# Patient Record
Sex: Female | Born: 1955 | Race: White | Hispanic: No | Marital: Married | Smoking: Never smoker
Health system: Southern US, Community
[De-identification: ages and names within clinical notes are randomized; demographics above are authoritative.]

## PROBLEM LIST (undated history)

## (undated) DIAGNOSIS — N200 Calculus of kidney: Secondary | ICD-10-CM

## (undated) DIAGNOSIS — E039 Hypothyroidism, unspecified: Secondary | ICD-10-CM

## (undated) DIAGNOSIS — E785 Hyperlipidemia, unspecified: Secondary | ICD-10-CM

## (undated) DIAGNOSIS — E669 Obesity, unspecified: Secondary | ICD-10-CM

## (undated) HISTORY — DX: Calculus of kidney: N20.0

## (undated) HISTORY — PX: OTHER SURGICAL HISTORY: SHX169

## (undated) HISTORY — PX: BREAST BIOPSY: SHX20

## (undated) HISTORY — DX: Hyperlipidemia, unspecified: E78.5

---

## 1990-06-09 HISTORY — PX: BACK SURGERY: SHX140

## 2003-10-09 ENCOUNTER — Other Ambulatory Visit: Admission: RE | Admit: 2003-10-09 | Discharge: 2003-10-09 | Payer: Self-pay | Admitting: Family Medicine

## 2005-12-16 ENCOUNTER — Other Ambulatory Visit: Admission: RE | Admit: 2005-12-16 | Discharge: 2005-12-16 | Payer: Self-pay | Admitting: Family Medicine

## 2008-10-20 ENCOUNTER — Encounter: Admission: RE | Admit: 2008-10-20 | Discharge: 2008-10-20 | Payer: Self-pay | Admitting: Family Medicine

## 2009-10-22 ENCOUNTER — Encounter: Admission: RE | Admit: 2009-10-22 | Discharge: 2009-10-22 | Payer: Self-pay | Admitting: Family Medicine

## 2010-09-25 ENCOUNTER — Other Ambulatory Visit: Payer: Self-pay | Admitting: Family Medicine

## 2010-09-25 DIAGNOSIS — Z1231 Encounter for screening mammogram for malignant neoplasm of breast: Secondary | ICD-10-CM

## 2010-10-24 ENCOUNTER — Ambulatory Visit
Admission: RE | Admit: 2010-10-24 | Discharge: 2010-10-24 | Disposition: A | Discharge status: Home / Self Care | Payer: 59 | Source: Ambulatory Visit | Attending: Family Medicine | Admitting: Family Medicine

## 2010-10-24 DIAGNOSIS — Z1231 Encounter for screening mammogram for malignant neoplasm of breast: Secondary | ICD-10-CM

## 2010-10-25 ENCOUNTER — Other Ambulatory Visit: Payer: Self-pay | Admitting: Family Medicine

## 2010-10-25 DIAGNOSIS — R928 Other abnormal and inconclusive findings on diagnostic imaging of breast: Secondary | ICD-10-CM

## 2010-11-01 ENCOUNTER — Other Ambulatory Visit: Payer: Self-pay | Admitting: Family Medicine

## 2010-11-01 ENCOUNTER — Ambulatory Visit
Admission: RE | Admit: 2010-11-01 | Discharge: 2010-11-01 | Disposition: A | Discharge status: Home / Self Care | Payer: 59 | Source: Ambulatory Visit | Attending: Family Medicine | Admitting: Family Medicine

## 2010-11-01 DIAGNOSIS — R928 Other abnormal and inconclusive findings on diagnostic imaging of breast: Secondary | ICD-10-CM

## 2010-11-05 ENCOUNTER — Ambulatory Visit
Admission: RE | Admit: 2010-11-05 | Discharge: 2010-11-05 | Disposition: A | Discharge status: Home / Self Care | Payer: 59 | Source: Ambulatory Visit | Attending: Family Medicine | Admitting: Family Medicine

## 2010-11-05 ENCOUNTER — Other Ambulatory Visit: Payer: Self-pay | Admitting: Family Medicine

## 2010-11-05 ENCOUNTER — Other Ambulatory Visit: Payer: Self-pay | Admitting: Diagnostic Radiology

## 2010-11-05 DIAGNOSIS — R928 Other abnormal and inconclusive findings on diagnostic imaging of breast: Secondary | ICD-10-CM

## 2011-10-13 ENCOUNTER — Other Ambulatory Visit: Payer: Self-pay | Admitting: Nurse Practitioner

## 2011-10-13 DIAGNOSIS — Z1231 Encounter for screening mammogram for malignant neoplasm of breast: Secondary | ICD-10-CM

## 2011-10-28 ENCOUNTER — Ambulatory Visit
Admission: RE | Admit: 2011-10-28 | Discharge: 2011-10-28 | Disposition: A | Discharge status: Home / Self Care | Payer: 59 | Source: Ambulatory Visit | Attending: Nurse Practitioner | Admitting: Nurse Practitioner

## 2011-10-28 DIAGNOSIS — Z1231 Encounter for screening mammogram for malignant neoplasm of breast: Secondary | ICD-10-CM

## 2011-10-31 ENCOUNTER — Other Ambulatory Visit: Payer: Self-pay | Admitting: Nurse Practitioner

## 2011-10-31 DIAGNOSIS — R928 Other abnormal and inconclusive findings on diagnostic imaging of breast: Secondary | ICD-10-CM

## 2011-11-11 ENCOUNTER — Ambulatory Visit
Admission: RE | Admit: 2011-11-11 | Discharge: 2011-11-11 | Disposition: A | Discharge status: Home / Self Care | Payer: 59 | Source: Ambulatory Visit | Attending: Nurse Practitioner | Admitting: Nurse Practitioner

## 2011-11-11 DIAGNOSIS — R928 Other abnormal and inconclusive findings on diagnostic imaging of breast: Secondary | ICD-10-CM

## 2012-01-01 ENCOUNTER — Inpatient Hospital Stay (HOSPITAL_COMMUNITY)
Admission: EM | Admit: 2012-01-01 | Discharge: 2012-01-05 | DRG: 493 | Disposition: A | Discharge status: Home Health | Payer: 59 | Attending: Orthopaedic Surgery | Admitting: Orthopaedic Surgery

## 2012-01-01 ENCOUNTER — Encounter (HOSPITAL_COMMUNITY): Payer: Self-pay | Admitting: Emergency Medicine

## 2012-01-01 ENCOUNTER — Emergency Department (HOSPITAL_COMMUNITY): Payer: 59

## 2012-01-01 DIAGNOSIS — S82852A Displaced trimalleolar fracture of left lower leg, initial encounter for closed fracture: Secondary | ICD-10-CM | POA: Diagnosis present

## 2012-01-01 DIAGNOSIS — W010XXA Fall on same level from slipping, tripping and stumbling without subsequent striking against object, initial encounter: Secondary | ICD-10-CM | POA: Diagnosis present

## 2012-01-01 DIAGNOSIS — Z809 Family history of malignant neoplasm, unspecified: Secondary | ICD-10-CM

## 2012-01-01 DIAGNOSIS — Z833 Family history of diabetes mellitus: Secondary | ICD-10-CM

## 2012-01-01 DIAGNOSIS — M1710 Unilateral primary osteoarthritis, unspecified knee: Secondary | ICD-10-CM | POA: Diagnosis present

## 2012-01-01 DIAGNOSIS — S82853A Displaced trimalleolar fracture of unspecified lower leg, initial encounter for closed fracture: Principal | ICD-10-CM | POA: Diagnosis present

## 2012-01-01 DIAGNOSIS — Z6841 Body Mass Index (BMI) 40.0 and over, adult: Secondary | ICD-10-CM

## 2012-01-01 DIAGNOSIS — Y92009 Unspecified place in unspecified non-institutional (private) residence as the place of occurrence of the external cause: Secondary | ICD-10-CM

## 2012-01-01 DIAGNOSIS — Z8249 Family history of ischemic heart disease and other diseases of the circulatory system: Secondary | ICD-10-CM

## 2012-01-01 DIAGNOSIS — E039 Hypothyroidism, unspecified: Secondary | ICD-10-CM | POA: Diagnosis present

## 2012-01-01 HISTORY — DX: Morbid (severe) obesity due to excess calories: E66.01

## 2012-01-01 HISTORY — DX: Obesity, unspecified: E66.9

## 2012-01-01 HISTORY — DX: Hypothyroidism, unspecified: E03.9

## 2012-01-01 NOTE — ED Notes (Signed)
Patient states her left knee gave out on her in the shower and she fell hurting her left ankle. Denies LOC.

## 2012-01-01 NOTE — ED Provider Notes (Signed)
History    This chart was scribed for [name], MD, MD by Smitty Pluck. The patient was seen in room APA09 and the patient's care was started at 10:12PM.   CSN: [id number]  Arrival date & time 01/01/12  2156   None     Chief Complaint  Patient presents with  . Fall  . Ankle Pain    (Consider location/radiation/quality/duration/timing/severity/associated sxs/prior treatment) Patient is a 56 y.o. female presenting with fall and ankle pain. The history is provided by the patient.  Fall  Ankle Pain    Courtney Simmons is a 56 y.o. female who presents to the Emergency Department BIB EMS due to fall while coming out of shower causing left ankle injury onset today at 8:30PM. Ankle pain is rated at 5/10. Reports that she has thyroid and she takes thyroxine. 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 Denies allergies to medication.  Dr. Daphine Deutscher at [facility].   Past Medical History  Diagnosis Date  . Hypothyroidism     Past Surgical History  Procedure Date  . Back surgery     History reviewed. No pertinent family history.  History  Substance Use Topics  . Smoking status: Never Smoker   . Smokeless tobacco: Not on file  . Alcohol Use: No    OB History    Grav Para Term Preterm Abortions TAB SAB Ect Mult Living                  Review of Systems  All other systems reviewed and are negative.  10 Systems reviewed and all are negative for acute change except as noted in the HPI.    Allergies  Review of patient's allergies indicates no known allergies.  Home Medications   Current Outpatient Rx  Name Route Sig Dispense Refill  . CHOLECALCIFEROL 400 UNITS PO TABS Oral Take 400 Units by mouth every morning.    . B-12 PO Oral Take 1 tablet by mouth every morning.    Marland Kitchen LEVOTHYROXINE SODIUM 50 MCG PO TABS Oral Take 50 mcg by mouth every morning.       BP 126/75  Pulse 85  Temp 98.3 F (36.8 C) (Oral)   Resp 20  Ht 5\' 7"  (1.702 m)  Wt 260 lb (117.935 kg)  BMI 40.72 kg/m2  SpO2 97%  Physical Exam  Nursing note and vitals reviewed. Constitutional: She is oriented to person, place, and time. She appears well-developed and well-nourished. No distress.       Obese      HENT:  Head: Normocephalic and atraumatic.  Eyes: Conjunctivae are normal.  Neck: Normal range of motion. Neck supple.  Pulmonary/Chest: Effort normal. No respiratory distress.  Musculoskeletal:       Tenderness of medial malleolus of left ankle Sensation of foot intact   Neurological: She is alert and oriented to person, place, and time.  Skin: Skin is warm and dry.  Psychiatric: She has a normal mood and affect. Her behavior is normal.    ED Course  Procedures (including critical care time) DIAGNOSTIC STUDIES: Oxygen Saturation is 97% on room air, normal by my interpretation.    COORDINATION OF CARE: 10:19PM EDP discusses pt ED treatment with pt     Labs Reviewed - No data to display Dg Ankle 2 Views Left  01/01/2012  *RADIOLOGY REPORT*  Clinical Data: Ankle pain status post fall.  LEFT ANKLE - 2 VIEW  Comparison:  None.  Findings: Two views are submitted, mildly limited by positioning and external stabilizer.  There is a comminuted oblique fracture of the distal fibula with posterior displacement.  There is a transverse fracture of the medial malleolus which is laterally displaced.  There is resulting posterior subluxation of the talus with respect to the tibial plafond.  No definite talar or calcaneal fracture is seen. The posterior malleolus of the tibia is likely fractured as well.  There is a plantar calcaneal spur.  IMPRESSION: Fracture dislocation (likely trimalleolar) at the left ankle as described.  Original Report Authenticated By: Gerrianne Scale, M.D.    11:34 PM X-rays show fracture dislocation of the left ankle.  Call to Dr. Hilda Lias, who is coming in to see her.   1. Trimalleolar fracture of  left ankle      I personally performed the services described in this documentation, which was scribed in my presence. The recorded information has been reviewed and considered.  [name], M.D.      Carleene Cooper III, MD 01/01/12 4540  Carleene Cooper III, MD 01/02/12 [phone]

## 2012-01-02 ENCOUNTER — Inpatient Hospital Stay (HOSPITAL_COMMUNITY): Payer: 59

## 2012-01-02 ENCOUNTER — Encounter (HOSPITAL_COMMUNITY): Payer: Self-pay | Admitting: Orthopaedic Surgery

## 2012-01-02 ENCOUNTER — Encounter (HOSPITAL_COMMUNITY): Payer: Self-pay | Admitting: Anesthesiology

## 2012-01-02 ENCOUNTER — Emergency Department (HOSPITAL_COMMUNITY): Payer: 59

## 2012-01-02 ENCOUNTER — Encounter (HOSPITAL_COMMUNITY)
Admission: EM | Disposition: A | Discharge status: Home Health | Payer: Self-pay | Source: Home / Self Care | Attending: Orthopaedic Surgery

## 2012-01-02 ENCOUNTER — Inpatient Hospital Stay (HOSPITAL_COMMUNITY): Payer: 59 | Admitting: Anesthesiology

## 2012-01-02 DIAGNOSIS — S82852A Displaced trimalleolar fracture of left lower leg, initial encounter for closed fracture: Secondary | ICD-10-CM | POA: Diagnosis present

## 2012-01-02 DIAGNOSIS — E039 Hypothyroidism, unspecified: Secondary | ICD-10-CM | POA: Diagnosis present

## 2012-01-02 DIAGNOSIS — S82853A Displaced trimalleolar fracture of unspecified lower leg, initial encounter for closed fracture: Principal | ICD-10-CM

## 2012-01-02 HISTORY — DX: Morbid (severe) obesity due to excess calories: E66.01

## 2012-01-02 HISTORY — PX: ORIF ANKLE FRACTURE: SHX5408

## 2012-01-02 LAB — CBC WITH DIFFERENTIAL/PLATELET
Basophils Absolute: 0 10*3/uL (ref 0.0–0.1)
Basophils Relative: 0 % (ref 0–1)
Eosinophils Absolute: 0.1 10*3/uL (ref 0.0–0.7)
Eosinophils Relative: 1 % (ref 0–5)
HCT: 37.8 % (ref 36.0–46.0)
Hemoglobin: 12.5 g/dL (ref 12.0–15.0)
Lymphocytes Relative: 15 % (ref 12–46)
Lymphs Abs: 1.9 10*3/uL (ref 0.7–4.0)
MCH: 29.6 pg (ref 26.0–34.0)
MCHC: 33.1 g/dL (ref 30.0–36.0)
MCV: 89.6 fL (ref 78.0–100.0)
Monocytes Absolute: 0.4 10*3/uL (ref 0.1–1.0)
Monocytes Relative: 3 % (ref 3–12)
Neutro Abs: 10.2 10*3/uL — ABNORMAL HIGH (ref 1.7–7.7)
Neutrophils Relative %: 81 % — ABNORMAL HIGH (ref 43–77)
Platelets: 317 10*3/uL (ref 150–400)
RBC: 4.22 MIL/uL (ref 3.87–5.11)
RDW: 13.7 % (ref 11.5–15.5)
WBC: 12.6 10*3/uL — ABNORMAL HIGH (ref 4.0–10.5)

## 2012-01-02 LAB — COMPREHENSIVE METABOLIC PANEL
ALT: 21 U/L (ref 0–35)
AST: 19 U/L (ref 0–37)
Albumin: 3.7 g/dL (ref 3.5–5.2)
Alkaline Phosphatase: 118 U/L — ABNORMAL HIGH (ref 39–117)
BUN: 13 mg/dL (ref 6–23)
CO2: 26 mEq/L (ref 19–32)
Calcium: 10 mg/dL (ref 8.4–10.5)
Chloride: 102 mEq/L (ref 96–112)
Creatinine, Ser: 0.8 mg/dL (ref 0.50–1.10)
GFR calc Af Amer: >90 mL/min (ref >90)
GFR calc non Af Amer: 82 mL/min — ABNORMAL LOW (ref >90)
Glucose, Bld: 115 mg/dL — ABNORMAL HIGH (ref 70–99)
Potassium: 4.2 mEq/L (ref 3.5–5.1)
Sodium: 136 mEq/L (ref 135–145)
Total Bilirubin: 0.3 mg/dL (ref 0.3–1.2)
Total Protein: 7.6 g/dL (ref 6.0–8.3)

## 2012-01-02 LAB — SURGICAL PCR SCREEN
MRSA, PCR: NEGATIVE
Staphylococcus aureus: NEGATIVE

## 2012-01-02 SURGERY — OPEN REDUCTION INTERNAL FIXATION (ORIF) ANKLE FRACTURE
Anesthesia: General | Site: Ankle | Laterality: Left | Wound class: Clean

## 2012-01-02 MED ORDER — ACETAMINOPHEN 10 MG/ML IV SOLN
1000.0000 mg | Freq: Four times a day (QID) | INTRAVENOUS | Status: AC
  Administered 2012-01-02 – 2012-01-03 (×4): 1000 mg via INTRAVENOUS
  Filled 2012-01-02 (×4): qty 100

## 2012-01-02 MED ORDER — CHOLECALCIFEROL 10 MCG (400 UNIT) PO TABS
400.0000 [IU] | ORAL_TABLET | Freq: Every day | ORAL | Status: DC
  Administered 2012-01-02 – 2012-01-05 (×4): 400 [IU] via ORAL
  Filled 2012-01-02 (×4): qty 1

## 2012-01-02 MED ORDER — CEFAZOLIN SODIUM 1-5 GM-% IV SOLN
INTRAVENOUS | Status: DC | PRN
  Administered 2012-01-02: 2 g via INTRAVENOUS

## 2012-01-02 MED ORDER — LACTATED RINGERS IV SOLN
INTRAVENOUS | Status: DC | PRN
  Administered 2012-01-02: 10:00:00 via INTRAVENOUS

## 2012-01-02 MED ORDER — MIDAZOLAM HCL 2 MG/2ML IJ SOLN
1.0000 mg | INTRAMUSCULAR | Status: DC | PRN
  Administered 2012-01-02: 2 mg via INTRAVENOUS

## 2012-01-02 MED ORDER — CEFAZOLIN SODIUM-DEXTROSE 2-3 GM-% IV SOLR
INTRAVENOUS | Status: AC
  Filled 2012-01-02: qty 50

## 2012-01-02 MED ORDER — METOPROLOL TARTRATE 25 MG PO TABS
25.0000 mg | ORAL_TABLET | Freq: Two times a day (BID) | ORAL | Status: DC
  Administered 2012-01-02 – 2012-01-05 (×7): 25 mg via ORAL
  Filled 2012-01-02 (×7): qty 1

## 2012-01-02 MED ORDER — SODIUM CHLORIDE 0.9 % IJ SOLN: 9.0000 mL | INTRAMUSCULAR | Status: DC | PRN

## 2012-01-02 MED ORDER — PROPOFOL 10 MG/ML IV EMUL
INTRAVENOUS | Status: DC | PRN
  Administered 2012-01-02: 200 mg via INTRAVENOUS

## 2012-01-02 MED ORDER — ONDANSETRON HCL 4 MG/2ML IJ SOLN
4.0000 mg | Freq: Once | INTRAMUSCULAR | Status: AC
  Administered 2012-01-02: 4 mg via INTRAVENOUS

## 2012-01-02 MED ORDER — ONDANSETRON HCL 4 MG/2ML IJ SOLN: 4.0000 mg | Freq: Four times a day (QID) | INTRAMUSCULAR | Status: DC | PRN

## 2012-01-02 MED ORDER — CEFAZOLIN SODIUM 1-5 GM-% IV SOLN
1.0000 g | Freq: Once | INTRAVENOUS | Status: DC
  Filled 2012-01-02: qty 50

## 2012-01-02 MED ORDER — ONDANSETRON HCL 4 MG/2ML IJ SOLN: 4.0000 mg | Freq: Once | INTRAMUSCULAR | Status: DC | PRN

## 2012-01-02 MED ORDER — MORPHINE SULFATE (PF) 1 MG/ML IV SOLN
INTRAVENOUS | Status: DC
  Administered 2012-01-02: 13.5 mg via INTRAVENOUS
  Administered 2012-01-02: 03:00:00 via INTRAVENOUS
  Administered 2012-01-02 – 2012-01-03 (×2): 1.5 mg via INTRAVENOUS
  Administered 2012-01-03: 4.5 mg via INTRAVENOUS
  Administered 2012-01-03: 0.2 mg via INTRAVENOUS
  Administered 2012-01-03: 3 mg via INTRAVENOUS
  Administered 2012-01-03: 18 mL via INTRAVENOUS
  Administered 2012-01-03 – 2012-01-04 (×2): 1.5 mg via INTRAVENOUS
  Administered 2012-01-04: 6 mg via INTRAVENOUS
  Administered 2012-01-04: 1.5 mg via INTRAVENOUS
  Administered 2012-01-04: via INTRAVENOUS
  Filled 2012-01-02 (×3): qty 25

## 2012-01-02 MED ORDER — FENTANYL CITRATE 0.05 MG/ML IJ SOLN
INTRAMUSCULAR | Status: DC | PRN
  Administered 2012-01-02 (×2): 25 ug via INTRAVENOUS
  Administered 2012-01-02: 50 ug via INTRAVENOUS
  Administered 2012-01-02 (×2): 25 ug via INTRAVENOUS
  Administered 2012-01-02: 50 ug via INTRAVENOUS
  Administered 2012-01-02 (×2): 25 ug via INTRAVENOUS

## 2012-01-02 MED ORDER — DEXTROSE-NACL 5-0.45 % IV SOLN
INTRAVENOUS | Status: DC
  Administered 2012-01-02 – 2012-01-03 (×3): via INTRAVENOUS

## 2012-01-02 MED ORDER — MIDAZOLAM HCL 2 MG/2ML IJ SOLN
INTRAMUSCULAR | Status: AC
  Filled 2012-01-02: qty 2

## 2012-01-02 MED ORDER — FENTANYL CITRATE 0.05 MG/ML IJ SOLN
25.0000 ug | INTRAMUSCULAR | Status: DC | PRN
  Administered 2012-01-02 (×2): 50 ug via INTRAVENOUS

## 2012-01-02 MED ORDER — ALBUTEROL SULFATE (5 MG/ML) 0.5% IN NEBU
2.5000 mg | INHALATION_SOLUTION | Freq: Four times a day (QID) | RESPIRATORY_TRACT | Status: DC
  Administered 2012-01-02 (×2): 2.5 mg via RESPIRATORY_TRACT
  Filled 2012-01-02 (×2): qty 0.5

## 2012-01-02 MED ORDER — DIPHENHYDRAMINE HCL 12.5 MG/5ML PO ELIX: 12.5000 mg | ORAL_SOLUTION | Freq: Four times a day (QID) | ORAL | Status: DC | PRN

## 2012-01-02 MED ORDER — ZOLPIDEM TARTRATE 5 MG PO TABS
5.0000 mg | ORAL_TABLET | Freq: Every day | ORAL | Status: DC
  Filled 2012-01-02: qty 1

## 2012-01-02 MED ORDER — FENTANYL CITRATE 0.05 MG/ML IJ SOLN
INTRAMUSCULAR | Status: AC
  Filled 2012-01-02: qty 5

## 2012-01-02 MED ORDER — LIDOCAINE HCL (CARDIAC) 10 MG/ML IV SOLN
INTRAVENOUS | Status: DC | PRN
  Administered 2012-01-02: 50 mg via INTRAVENOUS

## 2012-01-02 MED ORDER — GLYCOPYRROLATE 0.2 MG/ML IJ SOLN
0.2000 mg | Freq: Once | INTRAMUSCULAR | Status: AC
  Administered 2012-01-02: 0.2 mg via INTRAVENOUS

## 2012-01-02 MED ORDER — FENTANYL CITRATE 0.05 MG/ML IJ SOLN
INTRAMUSCULAR | Status: AC
  Filled 2012-01-02: qty 2

## 2012-01-02 MED ORDER — ROCURONIUM BROMIDE 50 MG/5ML IV SOLN
INTRAVENOUS | Status: AC
  Filled 2012-01-02: qty 1

## 2012-01-02 MED ORDER — ACETAMINOPHEN 325 MG PO TABS: 650.0000 mg | ORAL_TABLET | Freq: Four times a day (QID) | ORAL | Status: DC | PRN

## 2012-01-02 MED ORDER — LACTATED RINGERS IV SOLN
INTRAVENOUS | Status: DC
  Administered 2012-01-02: 10:00:00 via INTRAVENOUS

## 2012-01-02 MED ORDER — SODIUM CHLORIDE 0.9 % IR SOLN
Status: DC | PRN
  Administered 2012-01-02: 1000 mL

## 2012-01-02 MED ORDER — LEVOTHYROXINE SODIUM 50 MCG PO TABS
50.0000 ug | ORAL_TABLET | Freq: Every day | ORAL | Status: DC
  Administered 2012-01-02 – 2012-01-05 (×4): 50 ug via ORAL
  Filled 2012-01-02 (×4): qty 1

## 2012-01-02 MED ORDER — LIDOCAINE HCL (PF) 1 % IJ SOLN
INTRAMUSCULAR | Status: AC
  Filled 2012-01-02: qty 5

## 2012-01-02 MED ORDER — ENOXAPARIN SODIUM 40 MG/0.4ML ~~LOC~~ SOLN
40.0000 mg | SUBCUTANEOUS | Status: DC
  Administered 2012-01-03 – 2012-01-04 (×2): 40 mg via SUBCUTANEOUS
  Filled 2012-01-02 (×2): qty 0.4

## 2012-01-02 MED ORDER — NALOXONE HCL 0.4 MG/ML IJ SOLN: 0.4000 mg | INTRAMUSCULAR | Status: DC | PRN

## 2012-01-02 MED ORDER — ONDANSETRON HCL 4 MG/2ML IJ SOLN
INTRAMUSCULAR | Status: AC
  Filled 2012-01-02: qty 2

## 2012-01-02 MED ORDER — CEFAZOLIN SODIUM-DEXTROSE 2-3 GM-% IV SOLR
2.0000 g | Freq: Once | INTRAVENOUS | Status: DC
  Filled 2012-01-02: qty 50

## 2012-01-02 MED ORDER — GLYCOPYRROLATE 0.2 MG/ML IJ SOLN
INTRAMUSCULAR | Status: AC
  Filled 2012-01-02: qty 1

## 2012-01-02 MED ORDER — MAGNESIUM HYDROXIDE 400 MG/5ML PO SUSP: 30.0000 mL | Freq: Every day | ORAL | Status: DC | PRN

## 2012-01-02 MED ORDER — DIPHENHYDRAMINE HCL 50 MG/ML IJ SOLN: 12.5000 mg | Freq: Four times a day (QID) | INTRAMUSCULAR | Status: DC | PRN

## 2012-01-02 MED ORDER — PROPOFOL 10 MG/ML IV EMUL
INTRAVENOUS | Status: AC
  Filled 2012-01-02: qty 20

## 2012-01-02 SURGICAL SUPPLY — 68 items
BAG HAMPER (MISCELLANEOUS) ×2 IMPLANT
BANDAGE ELASTIC 4 VELCRO NS (GAUZE/BANDAGES/DRESSINGS) ×2 IMPLANT
BANDAGE ELASTIC 6 VELCRO NS (GAUZE/BANDAGES/DRESSINGS) ×2 IMPLANT
BANDAGE ESMARK 4X12 BL STRL LF (DISPOSABLE) ×1 IMPLANT
BIT DRILL 2.5X110 QC LCP DISP (BIT) ×4 IMPLANT
BIT DRILL 2.8 (BIT)
BIT DRILL CANN QC 2.8X165 (BIT) IMPLANT
BIT DRILL JC END 3.2X130 (BIT) IMPLANT
BIT DRILL LONG 2.7 (BIT) ×1 IMPLANT
BIT DRILL QC 3.5X110 (BIT) ×2 IMPLANT
BLADE SURG 15 STRL LF DISP TIS (BLADE) ×1 IMPLANT
BLADE SURG 15 STRL SS (BLADE) ×2
BLADE SURG SZ10 CARB STEEL (BLADE) ×2 IMPLANT
BNDG CMPR 12X4 ELC STRL LF (DISPOSABLE) ×1
BNDG ESMARK 4X12 BLUE STRL LF (DISPOSABLE) ×2
CLOTH BEACON ORANGE TIMEOUT ST (SAFETY) ×2 IMPLANT
COVER LIGHT HANDLE STERIS (MISCELLANEOUS) ×4 IMPLANT
COVER MAYO STAND XLG (DRAPE) ×2 IMPLANT
CUFF TOURNIQUET SINGLE 34IN LL (TOURNIQUET CUFF) ×2 IMPLANT
DRAPE C-ARM FOLDED MOBILE STRL (DRAPES) ×2 IMPLANT
DRILL BIT 2.8MM (BIT)
DRILL BIT LONG 2.7 (BIT) ×2
DURAPREP 26ML APPLICATOR (WOUND CARE) ×2 IMPLANT
GAUZE XEROFORM 5X9 LF (GAUZE/BANDAGES/DRESSINGS) ×2 IMPLANT
GLOVE BIO SURGEON STRL SZ8 (GLOVE) ×2 IMPLANT
GLOVE BIO SURGEON STRL SZ8.5 (GLOVE) ×2 IMPLANT
GLOVE BIOGEL M 6.5 STRL (GLOVE) ×2 IMPLANT
GLOVE BIOGEL PI IND STRL 7.0 (GLOVE) ×3 IMPLANT
GLOVE BIOGEL PI INDICATOR 7.0 (GLOVE) ×3
GLOVE ECLIPSE 6.5 STRL STRAW (GLOVE) ×2 IMPLANT
GLOVE EXAM NITRILE MD LF STRL (GLOVE) ×2 IMPLANT
GLOVE OPTIFIT SS 6.5 STRL BRWN (GLOVE) ×2 IMPLANT
GOWN STRL REIN XL XLG (GOWN DISPOSABLE) ×6 IMPLANT
INST SET MINOR BONE (KITS) ×2 IMPLANT
K-WIRE 229MX1.6 (WIRE) ×2 IMPLANT
K-WIRE 4.0X.028 (WIRE) ×2 IMPLANT
KIT ROOM TURNOVER APOR (KITS) ×2 IMPLANT
MANIFOLD NEPTUNE II (INSTRUMENTS) ×2 IMPLANT
NS IRRIG 1000ML POUR BTL (IV SOLUTION) ×2 IMPLANT
PACK BASIC LIMB (CUSTOM PROCEDURE TRAY) ×2 IMPLANT
PAD ABD 5X9 TENDERSORB (GAUZE/BANDAGES/DRESSINGS) ×6 IMPLANT
PAD ARMBOARD 7.5X6 YLW CONV (MISCELLANEOUS) ×2 IMPLANT
PAD CAST 4YDX4 CTTN HI CHSV (CAST SUPPLIES) ×2 IMPLANT
PADDING CAST COTTON 4X4 STRL (CAST SUPPLIES) ×4
PENCIL HANDSWITCHING (ELECTRODE) ×2 IMPLANT
PLATE LCP 3.5 1/3 TUB 6HX69 (Plate) ×2 IMPLANT
SCREW CANC FT 4.0X20 (Screw) ×2 IMPLANT
SCREW CORTEX 3.5 14MM (Screw) ×1 IMPLANT
SCREW CORTEX 3.5 16MM (Screw) ×1 IMPLANT
SCREW CORTEX 3.5 20MM (Screw) ×1 IMPLANT
SCREW CORTEX 3.5 22MM (Screw) ×1 IMPLANT
SCREW LOCK CORT ST 3.5X14 (Screw) ×1 IMPLANT
SCREW LOCK CORT ST 3.5X16 (Screw) ×1 IMPLANT
SCREW LOCK CORT ST 3.5X20 (Screw) ×1 IMPLANT
SCREW LOCK CORT ST 3.5X22 (Screw) ×1 IMPLANT
SCREW LOCK T15 FT 14X3.5X2.9X (Screw) ×1 IMPLANT
SCREW LOCKING 3.5X14 (Screw) ×2 IMPLANT
SCREW PROS MALLEO 55 26MM (Screw) ×2 IMPLANT
SET BASIN LINEN APH (SET/KITS/TRAYS/PACK) ×2 IMPLANT
SPLINT J 5 (CAST SUPPLIES) ×2 IMPLANT
SPONGE GAUZE 4X4 12PLY (GAUZE/BANDAGES/DRESSINGS) ×2 IMPLANT
SPONGE LAP 18X18 X RAY DECT (DISPOSABLE) ×2 IMPLANT
SPONGE LAP 4X18 X RAY DECT (DISPOSABLE) IMPLANT
STAPLER VISISTAT 35W (STAPLE) ×2 IMPLANT
SUT CHROMIC 2 0 CT 1 (SUTURE) ×4 IMPLANT
SUT NUROLON CT 2 BLK #1 18IN (SUTURE) IMPLANT
SUT PLAIN 2 0 XLH (SUTURE) ×4 IMPLANT
TOWEL OR 17X26 4PK STRL BLUE (TOWEL DISPOSABLE) ×2 IMPLANT

## 2012-01-02 NOTE — H&P (Signed)
Courtney Simmons is an 56 y.o. female.   Chief Complaint: I broke my left ankle HPI: She fell getting out of shower at home tonight and hurt her left ankle.  She had pop, severe pain and obvious deformity.  She dragged herself to room and in process the ankle sort of straightened up some.  X-rays show fracture/dislocation of the left ankle.  She has no other injury.  Past Medical History  Diagnosis Date  . Hypothyroidism     Past Surgical History  Procedure Date  . Back surgery     History reviewed. No pertinent family history. Social History:  reports that she has never smoked. She does not have any smokeless tobacco history on file. She reports that she does not drink alcohol or use illicit drugs.  Allergies: No Known Allergies   (Not in a hospital admission)  No results found for this or any previous visit (from the past 48 hour(s)). Dg Ankle 2 Views Left  01/01/2012  *RADIOLOGY REPORT*  Clinical Data: Ankle pain status post fall.  LEFT ANKLE - 2 VIEW  Comparison: None.  Findings: Two views are submitted, mildly limited by positioning and external stabilizer.  There is a comminuted oblique fracture of the distal fibula with posterior displacement.  There is a transverse fracture of the medial malleolus which is laterally displaced.  There is resulting posterior subluxation of the talus with respect to the tibial plafond.  No definite talar or calcaneal fracture is seen. The posterior malleolus of the tibia is likely fractured as well.  There is a plantar calcaneal spur.  IMPRESSION: Fracture dislocation (likely trimalleolar) at the left ankle as described.  Original Report Authenticated By: Gerrianne Scale, M.D.    Review of Systems  Constitutional: Negative.   HENT: Negative.   Eyes: Negative.   Respiratory: Negative.   Cardiovascular: Negative.   Gastrointestinal: Negative.   Genitourinary: Negative.   Musculoskeletal: Positive for falls (tonight with fracture of the left  ankle.  History of fracture of knee in past with no surgery.).  Skin: Negative.   Neurological: Negative.   Endo/Heme/Allergies: Negative.   Psychiatric/Behavioral: Negative.     Blood pressure 126/75, pulse 85, temperature 98.3 F (36.8 C), temperature source Oral, resp. rate 20, height 5\' 7"  (1.702 m), weight 117.935 kg (260 lb), SpO2 97.00%. Physical Exam  Constitutional: She is oriented to person, place, and time. She appears well-developed and well-nourished.  HENT:  Head: Normocephalic and atraumatic.  Eyes: Conjunctivae and EOM are normal. Pupils are equal, round, and reactive to light.  Neck: Normal range of motion. Neck supple.  Cardiovascular: Normal rate, regular rhythm, normal heart sounds and intact distal pulses.   Respiratory: Effort normal and breath sounds normal.  GI: Soft. Bowel sounds are normal.  Musculoskeletal: She exhibits tenderness (Pain left ankle with swelling and decreased motion).       Left ankle: She exhibits decreased range of motion, swelling and deformity. tenderness. Lateral malleolus and medial malleolus tenderness found.       Feet:  Neurological: She is alert and oriented to person, place, and time. She has normal reflexes.  Skin: Skin is warm and dry.  Psychiatric: She has a normal mood and affect. Her behavior is normal. Thought content normal.     Assessment/Plan Fracture/dislocation of the left ankle.    She will need surgery later today around lunch time.  I will have hospitalist see her tonight for pre-op evaluation.  I have gone over risks and imponderables  of the procedure with her including infection, embolus that could cause death, need for physical therapy, need for casting, and anesthesia risks among others.  I have discussed possibility of arthritis of the ankle in the future.  She appears to understand and agrees to the procedure.  She is placed in a posterior splint tonight.  She denies any other injury.  Kongmeng Santoro 01/02/2012,  12:14 AM

## 2012-01-02 NOTE — Progress Notes (Signed)
This is a 56 year old lady who has got a fracture dislocation of her left ankle. The only medical problem she has is hypothyroidism. She has no history of hypertension, diabetes, cerebrovascular disease, hyperlipidemia. She is obese. She does not describe any chest pain or dyspnea on exertion. She has no respiratory problems. She is a nonsmoker.  Impression: This lady is medically stable to have upcoming surgery. I do not think we need to follow her on daily basis unless there are problems postoperatively. Please contact us again if you need any assistance. I will sign off for the time being.

## 2012-01-02 NOTE — Anesthesia Procedure Notes (Signed)
Procedure Name: LMA Insertion Date/Time: 01/02/2012 10:29 AM Performed by: Carolyne Littles, Tanush Drees L Pre-anesthesia Checklist: Patient identified, Timeout performed, Emergency Drugs available, Suction available and Patient being monitored Patient Re-evaluated:Patient Re-evaluated prior to inductionOxygen Delivery Method: Circle system utilized Preoxygenation: Pre-oxygenation with 100% oxygen Intubation Type: IV induction Ventilation: Mask ventilation without difficulty LMA: LMA inserted LMA Size: 4.0 Placement Confirmation: positive ETCO2 and breath sounds checked- equal and bilateral Tube secured with: Tape Dental Injury: Teeth and Oropharynx as per pre-operative assessment

## 2012-01-02 NOTE — Brief Op Note (Signed)
01/01/2012 - 01/02/2012  11:30 AM  PATIENT:  Courtney Simmons  56 y.o. female  PRE-OPERATIVE DIAGNOSIS:  LEFT ANKLE FRACTURE/DISLOCATION  POST-OPERATIVE DIAGNOSIS:  LEFT ANKLE FRACTURE/DISLOCATION  PROCEDURE:  Procedure(s) (LRB): OPEN REDUCTION INTERNAL FIXATION (ORIF) ANKLE FRACTURE (Left)  SURGEON:  Surgeon(s) and Role:    * Darreld Mclean, MD - Primary  PHYSICIAN ASSISTANT:   ASSISTANTS: none   ANESTHESIA:   general  EBL:  Total I/O In: 500 [I.V.:500] Out: 150 [Urine:150]  BLOOD ADMINISTERED:none  DRAINS: none   LOCAL MEDICATIONS USED:  NONE  SPECIMEN:  No Specimen  DISPOSITION OF SPECIMEN:  N/A  COUNTS:  YES  TOURNIQUET:   Total Tourniquet Time Documented: Thigh (Left) - 35 minutes  DICTATION: .Other Dictation: Dictation Number [id number]  PLAN OF CARE: Admit to inpatient   PATIENT DISPOSITION:  PACU - hemodynamically stable.   Delay start of Pharmacological VTE agent (>24hrs) due to surgical blood loss or risk of bleeding: no

## 2012-01-02 NOTE — Addendum Note (Signed)
Addendum  created 01/02/12 1201 by Marolyn Hammock, CRNA   Modules edited:Anesthesia Events, Anesthesia Flowsheet

## 2012-01-02 NOTE — Anesthesia Preprocedure Evaluation (Addendum)
Anesthesia Evaluation  Patient identified by MRN, date of birth, ID band Patient awake    Reviewed: Allergy & Precautions, H&P , NPO status , Patient's Chart, lab work & pertinent test results, reviewed documented beta blocker date and time   Airway Mallampati: III TM Distance: >3 FB Neck ROM: Full    Dental No notable dental hx.    Pulmonary neg pulmonary ROS,    Pulmonary exam normal       Cardiovascular negative cardio ROS  Rhythm:Regular Rate:Normal     Neuro/Psych negative neurological ROS  negative psych ROS   GI/Hepatic negative GI ROS, Neg liver ROS,   Endo/Other  Hypothyroidism Morbid obesity  Renal/GU negative Renal ROS     Musculoskeletal negative musculoskeletal ROS (+)   Abdominal (+) + obese,  Abdomen: soft.    Peds  Hematology negative hematology ROS (+)   Anesthesia Other Findings   Reproductive/Obstetrics                          Anesthesia Physical Anesthesia Plan  ASA: II  Anesthesia Plan: General   Post-op Pain Management:    Induction: Intravenous  Airway Management Planned: LMA  Additional Equipment:   Intra-op Plan:   Post-operative Plan: Extubation in OR  Informed Consent: I have reviewed the patients History and Physical, chart, labs and discussed the procedure including the risks, benefits and alternatives for the proposed anesthesia with the patient or authorized representative who has indicated his/her understanding and acceptance.     Plan Discussed with: CRNA  Anesthesia Plan Comments:         Anesthesia Quick Evaluation

## 2012-01-02 NOTE — Anesthesia Postprocedure Evaluation (Signed)
  Anesthesia Post-op Note  Patient: Courtney Simmons  Procedure(s) Performed: Procedure(s) (LRB): OPEN REDUCTION INTERNAL FIXATION (ORIF) ANKLE FRACTURE (Left)  Patient Location: PACU  Anesthesia Type: General  Level of Consciousness: awake, alert , oriented and patient cooperative  Airway and Oxygen Therapy: Patient Spontanous Breathing and Patient connected to face mask oxygen  Post-op Pain: mild  Post-op Assessment: Post-op Vital signs reviewed, Patient's Cardiovascular Status Stable, Respiratory Function Stable, Patent Airway and No signs of Nausea or vomiting  Post-op Vital Signs: Reviewed and stable  Complications: No apparent anesthesia complications

## 2012-01-02 NOTE — Preoperative (Signed)
Beta Blockers   Reason not to administer Beta Blockers:Not Applicable 

## 2012-01-02 NOTE — Addendum Note (Signed)
Addendum  created 01/02/12 1201 by Amy L Andraza, CRNA   Modules edited:Anesthesia Events, Anesthesia Flowsheet    

## 2012-01-02 NOTE — Transfer of Care (Signed)
Immediate Anesthesia Transfer of Care Note  Patient: Courtney Simmons  Procedure(s) Performed: Procedure(s) (LRB): OPEN REDUCTION INTERNAL FIXATION (ORIF) ANKLE FRACTURE (Left)  Patient Location: PACU  Anesthesia Type: General  Level of Consciousness: awake, alert , oriented and patient cooperative  Airway & Oxygen Therapy: Patient Spontanous Breathing and Patient connected to face mask oxygen  Post-op Assessment: Report given to PACU RN and Post -op Vital signs reviewed and stable  Post vital signs: Reviewed and stable  Complications: No apparent anesthesia complications

## 2012-01-02 NOTE — Progress Notes (Signed)
UR Chart Review Completed  

## 2012-01-02 NOTE — Consult Note (Signed)
[facility] Medical Consultation  Idali Lafever WUJ:[id number] DOB: 11-13-1955 DOA: 01/01/2012 PCP: Benjamin Stain, at [facility]   Requesting physician: Darreld Mclean M.D.  Date of consultation: 01/02/2012  Reason for consultation: Medical clearance for left ankle surgery  Impression/Recommendations Morbid obesity Hypothyroidism Osteoarthritis of the right knee Left ankle fracture Relative listed entry lifestyle due to osteoarthritis No evidence of acute or chronic cardiovascular disease  Recommendations: Patient has less than 2 significant cardiac risk factors, and is scheduled for intermediate risk surgery, the patient can therefore be cleared to proceed with surgery this morning, but would recommend coverage with beta blockers in the perioperative period.  Counseled on weight reduction through appropriate diet and water aerobics or other nonweightbearing exercise.     Please contact us we can be of assistance further. Thank you for this consultation.   HPI: Courtney Simmons is an 56 y.o. female.  Morbidly obese Caucasian lady with hypothyroidism, who does a lot of walking on the job, but walks relatively slowly, climbs stairs relatively slowly, and is not engaged in any form of organized exercise because of osteoarthritis of the right knee, she tripped and fell while coming out of the shower yesterday. She sustained a trimalleolar fracture of the left ankle was admitted by the orthopedic service after reduction of the fracture.  She denies any history of chest pain or shortness of breath with activity or in relation to the fall. She denies any episodes of diaphoresis dizziness headaches or focal weakness.  She does have a family history of early cardiovascular disease and has never had cardiac evaluation  Rewiew of Systems:   All systems negative except as marked bold or noted in the HPI;  Constitutional: Negative for malaise, fever and  chills. ;  Eyes: Negative for eye pain, redness and discharge. ;  ENMT: Negative for ear pain, hoarseness, nasal congestion, sinus pressure and sore throat. ;  Cardiovascular: Negative for chest pain, palpitations, diaphoresis, dyspnea and peripheral edema. ;  Respiratory: Negative for cough, hemoptysis, wheezing and stridor. ;  Gastrointestinal: Negative for nausea, vomiting, diarrhea, constipation, abdominal pain, melena, blood in stool, hematemesis, jaundice and rectal bleeding. unusual weight loss..   Genitourinary: Negative for frequency, dysuria, incontinence,flank pain and hematuria; Musculoskeletal: Negative for back pain and neck pain. Negative for swelling and trauma.;  Skin: . Negative for pruritus, rash, abrasions, bruising and skin lesion.; ulcerations Neuro: Negative for headache, lightheadedness and neck stiffness. Negative for weakness, altered level of consciousness , altered mental status, extremity weakness, burning feet, involuntary movement, seizure and syncope.  Psych: negative for anxiety, depression, insomnia, tearfulness, panic attacks, hallucinations, paranoia, suicidal or homicidal ideation    Past Medical History  Diagnosis Date  . Hypothyroidism   . Obesity     Past Surgical History  Procedure Date  . Back surgery 1992    no prolems with surgery    Medications:  HOME MEDS: Prior to Admission medications   Medication Sig Start Date End Date Taking? Authorizing Provider  cholecalciferol (VITAMIN D) 400 UNITS TABS Take 400 Units by mouth every morning.   Yes Historical Provider, MD  Cyanocobalamin (B-12 PO) Take 1 tablet by mouth every morning.   Yes Historical Provider, MD  levothyroxine (SYNTHROID, LEVOTHROID) 50 MCG tablet Take 50 mcg by mouth every morning.    Yes Historical Provider, MD     Allergies:  No Known Allergies  Social History:   reports that she has never smoked. She does not have any smokeless tobacco  history on file. She reports that  she does not drink alcohol or use illicit drugs.  Family History: Family History  Problem Relation Age of Onset  . Heart attack Mother 42    died 57  . Hypertension Mother   . Diabetes Mother   . Gout Father   . Coronary artery disease Father   . Diabetes Sister   . Stroke Sister   . Coronary artery disease Sister     died age 11  . Obesity Sister   . Cancer Brother     died age 59  . Stroke Brother 65     Physical Exam: Filed Vitals:   01/01/12 2200 01/02/12 0228 01/02/12 0300 01/02/12 0315  BP: 126/75 117/63 102/67   Pulse: 85 74 78   Temp: 98.3 F (36.8 C) 98.2 F (36.8 C) 98.2 F (36.8 C)   TempSrc: Oral Oral Oral   Resp: 20 15 20 17   Height:      Weight:   122.29 kg (269 lb 9.6 oz)   SpO2: 97% 99% 98% 99%   Blood pressure 102/67, pulse 78, temperature 98.2 F (36.8 C), temperature source Oral, resp. rate 17, height 5\' 7"  (1.702 m), weight 122.29 kg (269 lb 9.6 oz), SpO2 99.00%.  GEN:  Pleasant morbidly obese Caucasian lady lying in bed in no acute distress; cooperative with exam PSYCH:  alert and oriented x4; does not appear anxious or depressed; affect is appropriate. HEENT: Mucous membranes pink and anicteric; PERRLA; EOM intact; no cervical lymphadenopathy nor thyromegaly or carotid bruit; no JVD; thick neck Breasts:: Not examined CHEST WALL: No tenderness CHEST: Normal respiration, clear to auscultation bilaterally HEART: Regular rate and rhythm; no murmurs rubs or gallops BACK: No kyphosis or scoliosis; no CVA tenderness ABDOMEN: Obese, soft non-tender; no masses, no organomegaly, normal abdominal bowel sounds; no pannus; no intertriginous candida. Rectal Exam: Not done EXTREMITIES:  age-appropriate arthropathy of the hands and knees; left leg and foot is in a plaster cast ;no edema; no ulcerations. Genitalia: not examined PULSES: 2+ and symmetric SKIN: Normal hydration no rash or ulceration CNS: Cranial nerves 2-12 grossly intact no focal  lateralizing neurologic deficit   Labs on Admission:  Basic Metabolic Panel:  Lab 01/01/12 1610  NA 136  K 4.2  CL 102  CO2 26  GLUCOSE 115*  BUN 13  CREATININE 0.80  CALCIUM 10.0  MG --  PHOS --   Liver Function Tests:  Lab 01/01/12 2355  AST 19  ALT 21  ALKPHOS 118*  BILITOT 0.3  PROT 7.6  ALBUMIN 3.7   No results found for this basename: LIPASE:5,AMYLASE:5 in the last 168 hours No results found for this basename: AMMONIA:5 in the last 168 hours CBC:  Lab 01/01/12 2355  WBC 12.6*  NEUTROABS 10.2*  HGB 12.5  HCT 37.8  MCV 89.6  PLT 317   Cardiac Enzymes: No results found for this basename: CKTOTAL:5,CKMB:5,CKMBINDEX:5,TROPONINI:5 in the last 168 hours BNP: No components found with this basename: POCBNP:5 CBG: No results found for this basename: GLUCAP:5 in the last 168 hours  Results for orders placed during the hospital encounter of 01/01/12 (from the past 48 hour(s))  CBC WITH DIFFERENTIAL     Status: Abnormal   Collection Time   01/01/12 11:55 PM      Component Value Range Comment   WBC 12.6 (*) 4.0 - 10.5 K/uL    RBC 4.22  3.87 - 5.11 MIL/uL    Hemoglobin 12.5  12.0 - 15.0 g/dL  HCT 37.8  36.0 - 46.0 %    MCV 89.6  78.0 - 100.0 fL    MCH 29.6  26.0 - 34.0 pg    MCHC 33.1  30.0 - 36.0 g/dL    RDW 16.1  09.6 - 04.5 %    Platelets 317  150 - 400 K/uL    Neutrophils Relative 81 (*) 43 - 77 %    Neutro Abs 10.2 (*) 1.7 - 7.7 K/uL    Lymphocytes Relative 15  12 - 46 %    Lymphs Abs 1.9  0.7 - 4.0 K/uL    Monocytes Relative 3  3 - 12 %    Monocytes Absolute 0.4  0.1 - 1.0 K/uL    Eosinophils Relative 1  0 - 5 %    Eosinophils Absolute 0.1  0.0 - 0.7 K/uL    Basophils Relative 0  0 - 1 %    Basophils Absolute 0.0  0.0 - 0.1 K/uL   COMPREHENSIVE METABOLIC PANEL     Status: Abnormal   Collection Time   01/01/12 11:55 PM      Component Value Range Comment   Sodium 136  135 - 145 mEq/L    Potassium 4.2  3.5 - 5.1 mEq/L    Chloride 102  96 - 112  mEq/L    CO2 26  19 - 32 mEq/L    Glucose, Bld 115 (*) 70 - 99 mg/dL    BUN 13  6 - 23 mg/dL    Creatinine, Ser 4.09  0.50 - 1.10 mg/dL    Calcium 81.1  8.4 - 10.5 mg/dL    Total Protein 7.6  6.0 - 8.3 g/dL    Albumin 3.7  3.5 - 5.2 g/dL    AST 19  0 - 37 U/L    ALT 21  0 - 35 U/L    Alkaline Phosphatase 118 (*) 39 - 117 U/L    Total Bilirubin 0.3  0.3 - 1.2 mg/dL    GFR calc non Af Amer 82 (*) >90 mL/min    GFR calc Af Amer >90  >90 mL/min      Radiological Exams on Admission: Dg Ankle 2 Views Left  01/02/2012  *RADIOLOGY REPORT*  Clinical Data: Reduction of trimalleolar left ankle fracture dislocation.  LEFT ANKLE - 2 VIEW  Comparison: Films earlier on 01/01/2012  Findings: Imaging in a plaster splint demonstrates improved alignment at the level of the trimalleolar fracture dislocation. There remains a displaced medial malleolar fracture and asymmetric widening of the medial mortise.  Mild displacement remains present at the level of the fibular fracture.  IMPRESSION: Improved alignment after reduction.  There remains displacement at the level of the medial malleolar fracture with associated widening of the medial mortise.  Mild displacement also present at the level of the lateral malleolar fracture.  Original Report Authenticated By: Reola Calkins, M.D.   Dg Ankle 2 Views Left  01/01/2012  *RADIOLOGY REPORT*  Clinical Data: Ankle pain status post fall.  LEFT ANKLE - 2 VIEW  Comparison: None.  Findings: Two views are submitted, mildly limited by positioning and external stabilizer.  There is a comminuted oblique fracture of the distal fibula with posterior displacement.  There is a transverse fracture of the medial malleolus which is laterally displaced.  There is resulting posterior subluxation of the talus with respect to the tibial plafond.  No definite talar or calcaneal fracture is seen. The posterior malleolus of the tibia is likely fractured as  well.  There is a plantar calcaneal  spur.  IMPRESSION: Fracture dislocation (likely trimalleolar) at the left ankle as described.  Original Report Authenticated By: Gerrianne Scale, M.D.       Code Status: FULL CODE  Family Communication: Husband; Reita Cliche [phone]  Disposition Plan: Per primary team   time spent: 75 minutes.   Imari Sivertsen Nocturnist [facility] Pager [phone]   01/02/2012, 5:19 AM

## 2012-01-03 LAB — BASIC METABOLIC PANEL
BUN: 11 mg/dL (ref 6–23)
CO2: 28 mEq/L (ref 19–32)
Calcium: 8.8 mg/dL (ref 8.4–10.5)
Chloride: 102 mEq/L (ref 96–112)
Creatinine, Ser: 0.75 mg/dL (ref 0.50–1.10)
GFR calc Af Amer: >90 mL/min (ref >90)
GFR calc non Af Amer: >90 mL/min (ref >90)
Glucose, Bld: 104 mg/dL — ABNORMAL HIGH (ref 70–99)
Potassium: 3.9 mEq/L (ref 3.5–5.1)
Sodium: 137 mEq/L (ref 135–145)

## 2012-01-03 LAB — CBC WITH DIFFERENTIAL/PLATELET
Basophils Absolute: 0 10*3/uL (ref 0.0–0.1)
Basophils Relative: 0 % (ref 0–1)
Eosinophils Absolute: 0.1 10*3/uL (ref 0.0–0.7)
Eosinophils Relative: 1 % (ref 0–5)
HCT: 35.6 % — ABNORMAL LOW (ref 36.0–46.0)
Hemoglobin: 11.4 g/dL — ABNORMAL LOW (ref 12.0–15.0)
Lymphocytes Relative: 21 % (ref 12–46)
Lymphs Abs: 2.2 10*3/uL (ref 0.7–4.0)
MCH: 29.6 pg (ref 26.0–34.0)
MCHC: 32 g/dL (ref 30.0–36.0)
MCV: 92.5 fL (ref 78.0–100.0)
Monocytes Absolute: 0.6 10*3/uL (ref 0.1–1.0)
Monocytes Relative: 6 % (ref 3–12)
Neutro Abs: 7.2 10*3/uL (ref 1.7–7.7)
Neutrophils Relative %: 71 % (ref 43–77)
Platelets: 290 10*3/uL (ref 150–400)
RBC: 3.85 MIL/uL — ABNORMAL LOW (ref 3.87–5.11)
RDW: 14 % (ref 11.5–15.5)
WBC: 10.1 10*3/uL (ref 4.0–10.5)

## 2012-01-03 MED ORDER — SODIUM CHLORIDE 0.9 % IJ SOLN
INTRAMUSCULAR | Status: AC
  Administered 2012-01-03: 10 mL
  Filled 2012-01-03: qty 3

## 2012-01-03 NOTE — Evaluation (Signed)
Physical Therapy Evaluation Patient Details Name: Courtney Simmons MRN: [id number] DOB: 11-07-1955 Today's Date: 01/03/2012 Time: 1300-1400 PT Time Calculation (min): 60 min  PT Assessment / Plan / Recommendation Clinical Impression  Pt was very motivated and pleasant during the time of eval. Pt made aware of WB status to LLE; able to participate during exercises and transfer activities (bed<>3-in-1 commode, and bed to recliner) at Mod A using a RW maintaining appropriate WB status (TTWB) to LLE 90-100% of the time. Patient has great potential for further improvement and I may suggest SNF placement should be d/c from hospital. Patient lives with husband and I think it will be difficult to assist patient at this time. Patient has steps to enter the house that will require stair training later on in therapy. Nurse and nurset tech made aware of transfer and WB status of patient; left a RW and gait belt to be used upon transfer back to bed from recliner.     PT Assessment  Patient needs continued PT services    Follow Up Recommendations  Skilled nursing facility    Barriers to Discharge Decreased caregiver support;Inaccessible home environment      Equipment Recommendations  Defer to next venue    Recommendations for Other Services     Frequency 7X/week    Precautions / Restrictions Precautions Precautions: Fall Restrictions Weight Bearing Restrictions: Yes RLE Weight Bearing: Touchdown weight bearing RLE Partial Weight Bearing Percentage or Pounds: erratum: Full weight bearing to RLE; no precautions. LLE at TTWB  LLE Weight Bearing: Touchdown weight bearing         Mobility  Bed Mobility Bed Mobility: Rolling Right;Rolling Left;Right Sidelying to Sit;Left Sidelying to Sit;Supine to Sit;Sitting - Scoot to Edge of Bed;Sit to Supine;Sit to Sidelying Right;Sit to Sidelying Left Rolling Right: 3: Mod assist Rolling Left: 3: Mod assist Right Sidelying to Sit: 3: Mod assist Left  Sidelying to Sit: 3: Mod assist Supine to Sit: 2: Max assist Sitting - Scoot to [place] of Bed: 4: Min assist Sit to Supine: 3: Mod assist Sit to Sidelying Right: 3: Mod assist Sit to Sidelying Left: 3: Mod assist Transfers Transfers: Sit to Stand;Stand to Sit;Stand Pivot Transfers Sit to Stand: 3: Mod assist;With upper extremity assist;From bed;From chair/3-in-1 Stand to Sit: 4: Min assist Stand Pivot Transfers: 3: Mod assist Ambulation/Gait Ambulation/Gait Assistance: 3: Mod assist Ambulation Distance (Feet): 2 Feet Assistive device: Rolling walker Gait Pattern: Step-to pattern;Decreased step length - right;Lateral trunk lean to right;Antalgic Gait velocity: slowed  General Gait Details: TTWB to LLE with carryover 90-100% of the time; step-to pattern  Stairs: No    Exercises Total Joint Exercises Ankle Circles/Pumps: AROM;20 reps;Right;Supine;Seated Quad Sets: AROM;Both;20 reps;Supine Short Arc Quad: AROM;Both;Supine;20 reps Hip ABduction/ADduction: AROM;20 reps;Supine;Both Long Arc Quad: AROM;10 reps;Seated;Both Knee Flexion: AROM;10 reps;Both;Seated   PT Diagnosis: Difficulty walking;Generalized weakness  PT Problem List: Decreased range of motion;Decreased activity tolerance;Decreased balance;Decreased mobility;Decreased strength;Decreased safety awareness PT Treatment Interventions: Stair training;Gait training;Functional mobility training;Therapeutic activities;Therapeutic exercise;Balance training;Patient/family education   PT Goals Acute Rehab PT Goals PT Goal Formulation: With patient Potential to Achieve Goals: Good Pt will go Supine/Side to Sit: with min assist PT Goal: Supine/Side to Sit - Progress: Goal set today Pt will go Sit to Supine/Side: with min assist PT Goal: Sit to Supine/Side - Progress: Goal set today Pt will go Sit to Stand: with min assist PT Goal: Sit to Stand - Progress: Goal set today Pt will go Stand to Sit: with supervision PT Goal: Stand  to  Sit - Progress: Goal set today Pt will Transfer Bed to Chair/Chair to Bed: with min assist PT Transfer Goal: Bed to Chair/Chair to Bed - Progress: Goal set today Pt will Ambulate: 1 - 15 feet;with min assist;with rolling walker PT Goal: Ambulate - Progress: Goal set today Pt will Go Up / Down Stairs: 1-2 stairs;with max assist PT Goal: Up/Down Stairs - Progress: Goal set today  Visit Information  Last PT Received On: 01/03/12    Subjective Data  Patient Stated Goal: To be able to walk    Prior Functioning  Home Living Lives With: Spouse Type of Home: House Home Access: Stairs to enter [employer] of Steps: 3-5 steps to front and back entrance Entrance Stairs-Rails: Right Home Layout: One level Bathroom Toilet: Standard Home Adaptive Equipment: None Prior Function Level of Independence: Independent Able to Take Stairs?: Yes Driving: Yes Communication Communication: No difficulties Dominant Hand: Right    Cognition  Overall Cognitive Status: Appears within functional limits for tasks assessed/performed Arousal/Alertness: Awake/alert Orientation Level: Appears intact for tasks assessed Behavior During Session: [facility] for tasks performed    Extremity/Trunk Assessment Left Lower Extremity Assessment LLE ROM/Strength/Tone: Deficits   Balance    End of Session PT - End of Session Equipment Utilized During Treatment: Gait belt Activity Tolerance: Patient tolerated treatment well Patient left: in bed;with call bell/phone within reach;with family/visitor present Nurse Communication: Mobility status;Weight bearing status;Precautions (Left walker and gait belt in room;nurse tech and nurse aware)  GP     Morganna Styles Cresenciano Genre 01/03/2012, 2:17 PM

## 2012-01-03 NOTE — Progress Notes (Signed)
Subjective: 1 Day Post-Op Procedure(s) (LRB): OPEN REDUCTION INTERNAL FIXATION (ORIF) ANKLE FRACTURE (Left) Patient reports pain as 3 on 0-10 scale.    Objective: Vital signs in last 24 hours: Temp:  [97.6 F (36.4 C)-100 F (37.8 C)] 98.7 F (37.1 C) (07/27 0415) Pulse Rate:  [62-83] 76  (07/27 0415) Resp:  [10-24] 24  (07/27 0719) BP: (98-143)/(58-82) 135/67 mmHg (07/27 0415) SpO2:  [87 %-100 %] 97 % (07/27 0719)  Intake/Output from previous day: 07/26 0701 - 07/27 0700 In: 2543 [P.O.:480; I.V.:1913; IV Piggyback:150] Out: 1200 [Urine:1200] Intake/Output this shift: Total I/O In: -  Out: 700 [Urine:700]   Basename 01/03/12 0515 01/01/12 2355  HGB 11.4* 12.5    Basename 01/03/12 0515 01/01/12 2355  WBC 10.1 12.6*  RBC 3.85* 4.22  HCT 35.6* 37.8  PLT 290 317    Basename 01/03/12 0515 01/01/12 2355  NA 137 136  K 3.9 4.2  CL 102 102  CO2 28 26  BUN 11 13  CREATININE 0.75 0.80  GLUCOSE 104* 115*  CALCIUM 8.8 10.0   No results found for this basename: LABPT:2,INR:2 in the last 72 hours  Neurologically intact Neurovascular intact Sensation intact distally Intact pulses distally  Assessment/Plan: 1 Day Post-Op Procedure(s) (LRB): OPEN REDUCTION INTERNAL FIXATION (ORIF) ANKLE FRACTURE (Left) Up with therapy  Kebin Maye 01/03/2012, 9:58 AM

## 2012-01-03 NOTE — Op Note (Signed)
NAMEMARYAH, Courtney Simmons               ACCOUNT NO.:  [id number]  MEDICAL RECORD NO.:  [id number]  LOCATION:  A211                          FACILITY:  [facility]  PHYSICIAN:  J. Darreld Mclean, M.D. DATE OF BIRTH:  10-Apr-1956  DATE OF PROCEDURE: DATE OF DISCHARGE:                              OPERATIVE REPORT   PREOPERATIVE DIAGNOSIS:  Bimalleolar fracture, left ankle status post dislocation.  POSTOPERATIVE DIAGNOSIS:  Bimalleolar fracture, left ankle status post dislocation.  PROCEDURE:  Open reduction and internal fixation of left ankle fracture medial and lateral malleolus.  ANESTHESIA:  General.  SURGEON:  J. Darreld Mclean, MD  DRAINS:  No drains, posterior splint applied.  INDICATIONS:  The patient fell last night at home sustaining above- mentioned injury.  She was reduced in the ER and placed in a posterior splint.  I went over the risks and imponderables of the need for surgery including infection, possibility of embolism, which could lead to death, need for physical therapy both in the hospital after the fracture heals, immobilization, and anesthesia risks.  She appeared to understand and agreed to the procedure as outlined.  DESCRIPTION OF PROCEDURE:  The patient was seen in the holding area identified the left ankle as correct surgical site.  She was brought to the OR and given anesthesia.  She was supine on the table.  Tourniquet placed, deflated left upper thigh.  Cast was removed.  She was prepped and draped in the usual manner.  Generalized time-out identifying the patient as Ms. Eye and her left ankle fracture.  __________ film was made with the C-arm fluoroscopy. Incision was made first medially, medial malleolar fracture was identified, removed hematoma.  The talus was examined, there was no apparent injury to the talus.  Fracture was anatomically reduced from the lateral malleolus.  0.062 smooth Kirschner wire was placed and then a 3.2 drill was used and  __________ long malleolar screw was inserted. X-rays looked good.  Medial side was reduced anatomic.  Attention directed into the lateral side.  Incision was made.  Fracture was held in place and a 6-hole side plate chosen, screws were placed.  These measured 14 mm to 20 mm.  We used a cancellous screw on the most inferior aspect, locking screw, second from the most proximal screw. This reduced the fracture, mortise looked good, x-rays were taken. Wound was then reapproximated using 2-0 chromic, 2-0 plain, and skin staples.  Bulky dressing applied.  Tourniquet deflated after 35 minutes and then a posterior splint applied.  The patient tolerated the procedure well, will go to recovery in good condition.  She is of course admitted to the hospital.          ______________________________ J. Darreld Mclean, M.D.     JWK/MEDQ  D:  01/02/2012  T:  01/03/2012  Job:  [id number]

## 2012-01-04 LAB — BASIC METABOLIC PANEL
BUN: 8 mg/dL (ref 6–23)
CO2: 28 mEq/L (ref 19–32)
Calcium: 9 mg/dL (ref 8.4–10.5)
Chloride: 102 mEq/L (ref 96–112)
Creatinine, Ser: 0.63 mg/dL (ref 0.50–1.10)
GFR calc Af Amer: >90 mL/min (ref >90)
GFR calc non Af Amer: >90 mL/min (ref >90)
Glucose, Bld: 130 mg/dL — ABNORMAL HIGH (ref 70–99)
Potassium: 3.7 mEq/L (ref 3.5–5.1)
Sodium: 137 mEq/L (ref 135–145)

## 2012-01-04 MED ORDER — MORPHINE SULFATE 2 MG/ML IJ SOLN: 2.0000 mg | INTRAMUSCULAR | Status: DC | PRN

## 2012-01-04 MED ORDER — PROMETHAZINE HCL 25 MG/ML IJ SOLN: 25.0000 mg | INTRAMUSCULAR | Status: DC | PRN

## 2012-01-04 MED ORDER — OXYCODONE-ACETAMINOPHEN 5-325 MG PO TABS
1.0000 | ORAL_TABLET | ORAL | Status: DC | PRN
  Administered 2012-01-04: 1 via ORAL
  Filled 2012-01-04: qty 1

## 2012-01-04 NOTE — Progress Notes (Signed)
Physical Therapy Treatment Patient Details Name: Courtney Simmons MRN: [id number] DOB: May 07, 1956 Today's Date: 01/04/2012 Time: 7829-5621 PT Time Calculation (min): 40 min  PT Assessment / Plan / Recommendation Comments on Treatment Session  Pt did very well today in therapy. Patient was able to make progress during transfer activities (bed<>3-in-1 commode) and short distance, in-room ambulation ~24feet at Mod A using a RW and able to maintain appropriate WB status to LLE (TTWB). Patient c/o no pain before and after tx session. Pt verbalized that she wants to return home and do homehealth PT. Pt resides with husband and has steps to entrance. Pt's WB status remained at TWB to LLE and short-term SNF placement may be needed at this time for strengthening and ambulation/stair training prior to discharge to home to ensure safety. Pt left in recliner with call be in reach; RW and gait belt in room for transfer back to bed.     Follow Up Recommendations  Skilled nursing facility    Barriers to Discharge        Equipment Recommendations       Recommendations for Other Services    Frequency 7X/week   Plan Discharge plan remains appropriate    Precautions / Restrictions Precautions Precautions: Fall Restrictions Weight Bearing Restrictions: Yes LLE Weight Bearing: Touchdown weight bearing       Mobility  Bed Mobility Bed Mobility: Supine to Sit;Sitting - Scoot to [place] of Bed;Left Sidelying to Sit;Rolling Left Rolling Left: 4: Min assist Right Sidelying to Sit: 4: Min assist Left Sidelying to Sit: 3: Mod assist Supine to Sit: 3: Mod assist Sitting - Scoot to Edge of Bed: 4: Min guard Transfers Transfers: Sit to Stand;Stand to [employer] Transfers Sit to Stand: 4: Min assist Stand to Sit: 4: Min guard Stand Pivot Transfers: 4: Min assist Ambulation/Gait Ambulation/Gait Assistance: 4: Min Environmental consultant (Feet): 10 Feet Assistive device: Rolling  walker Ambulation/Gait Assistance Details: maintained TTWB to LLE  Gait Pattern: Step-to pattern;Decreased hip/knee flexion - right;Decreased step length - right;Decreased trunk rotation    Exercises Total Joint Exercises Ankle Circles/Pumps: AROM;20 reps;Seated;Supine;Right Quad Sets: AROM;Both;10 reps;Supine Hip ABduction/ADduction: AROM;20 reps;Supine;Both Long Arc Quad: AROM;20 reps;Both;Seated Knee Flexion: AROM;Supine;Both;20 reps   PT Diagnosis:    PT Problem List:   PT Treatment Interventions:     PT Goals Acute Rehab PT Goals PT Goal Formulation: With patient Potential to Achieve Goals: Good Pt will go Supine/Side to Sit: with supervision PT Goal: Supine/Side to Sit - Progress: Goal set today Pt will go Sit to Supine/Side: with supervision PT Goal: Sit to Supine/Side - Progress: Goal set today Pt will go Sit to Stand: with supervision PT Goal: Sit to Stand - Progress: Goal set today Pt will go Stand to Sit: with supervision PT Goal: Stand to Sit - Progress: Goal set today Pt will Transfer Bed to Chair/Chair to Bed: with supervision PT Transfer Goal: Bed to Chair/Chair to Bed - Progress: Goal set today Pt will Ambulate: 16 - 50 feet;with supervision;with rolling walker PT Goal: Ambulate - Progress: Goal set today Pt will Go Up / Down Stairs: 1-2 stairs PT Goal: Up/Down Stairs - Progress: Goal set today  Visit Information  Last PT Received On: 01/04/12    Subjective Data  Patient Stated Goal: To be able to walk and return home    Cognition  Overall Cognitive Status: Appears within functional limits for tasks assessed/performed Arousal/Alertness: Awake/alert Orientation Level: Appears intact for tasks assessed Behavior During Session: [facility] for tasks  performed    Balance     End of Session PT - End of Session Equipment Utilized During Treatment: Gait belt Activity Tolerance: Patient tolerated treatment well Patient left: in chair;with call bell/phone within  reach Nurse Communication: Mobility status;Weight bearing status   GP     Courtney Simmons, Courtney Simmons 01/04/2012, 1:00 PM

## 2012-01-04 NOTE — Progress Notes (Signed)
Subjective: 2 Days Post-Op Procedure(s) (LRB): OPEN REDUCTION INTERNAL FIXATION (ORIF) ANKLE FRACTURE (Left) Patient reports pain as 2 on 0-10 scale.    Objective: Vital signs in last 24 hours: Temp:  [98.8 F (37.1 C)-99.8 F (37.7 C)] 98.8 F (37.1 C) (07/28 0522) Pulse Rate:  [81-90] 81  (07/28 0522) Resp:  [14-20] 16  (07/28 0814) BP: (99-113)/(66-77) 99/66 mmHg (07/28 0522) SpO2:  [97 %-99 %] 97 % (07/28 0814)  Intake/Output from previous day: 07/27 0701 - 07/28 0700 In: 2811 [P.O.:600; I.V.:2211] Out: 2400 [Urine:2400] Intake/Output this shift:     Basename 01/03/12 0515 01/01/12 2355  HGB 11.4* 12.5    Basename 01/03/12 0515 01/01/12 2355  WBC 10.1 12.6*  RBC 3.85* 4.22  HCT 35.6* 37.8  PLT 290 317    Basename 01/04/12 0446 01/03/12 0515  NA 137 137  K 3.7 3.9  CL 102 102  CO2 28 28  BUN 8 11  CREATININE 0.63 0.75  GLUCOSE 130* 104*  CALCIUM 9.0 8.8   No results found for this basename: LABPT:2,INR:2 in the last 72 hours  Neurologically intact Neurovascular intact Sensation intact distally Intact pulses distally Dorsiflexion/Plantar flexion intact  Assessment/Plan: 2 Days Post-Op Procedure(s) (LRB): OPEN REDUCTION INTERNAL FIXATION (ORIF) ANKLE FRACTURE (Left) Up with therapy Consider discharge tomorrow.  To have physical therapy today.  To begin oral pain medicines.  She did well in therapy yesterday. Jaiveer Panas 01/04/2012, 8:37 AM

## 2012-01-04 NOTE — Anesthesia Postprocedure Evaluation (Signed)
  Anesthesia Post-op Note  Patient: Courtney Simmons  Procedure(s) Performed: Procedure(s) (LRB): OPEN REDUCTION INTERNAL FIXATION (ORIF) ANKLE FRACTURE (Left)  Patient Location: Room 211  Anesthesia Type: General  Level of Consciousness: awake, alert , oriented and patient cooperative  Airway and Oxygen Therapy: Patient Spontanous Breathing  Post-op Pain: mild  Post-op Assessment: Post-op Vital signs reviewed, Patient's Cardiovascular Status Stable and Respiratory Function Stable  Post-op Vital Signs: Reviewed and stable  Complications: No apparent anesthesia complications

## 2012-01-04 NOTE — Addendum Note (Signed)
Addendum  created 01/04/12 1545 by Marolyn Hammock, CRNA   Modules edited:Notes Section

## 2012-01-05 LAB — BASIC METABOLIC PANEL
BUN: 11 mg/dL (ref 6–23)
CO2: 28 mEq/L (ref 19–32)
Calcium: 9.3 mg/dL (ref 8.4–10.5)
Chloride: 105 mEq/L (ref 96–112)
Creatinine, Ser: 0.71 mg/dL (ref 0.50–1.10)
GFR calc Af Amer: >90 mL/min (ref >90)
GFR calc non Af Amer: >90 mL/min (ref >90)
Glucose, Bld: 103 mg/dL — ABNORMAL HIGH (ref 70–99)
Potassium: 3.7 mEq/L (ref 3.5–5.1)
Sodium: 141 mEq/L (ref 135–145)

## 2012-01-05 MED ORDER — OXYCODONE-ACETAMINOPHEN 7.5-325 MG PO TABS: 1.0000 | ORAL_TABLET | ORAL | Status: AC | PRN

## 2012-01-05 NOTE — Care Management Note (Signed)
    Page 1 of 2   01/05/2012     10:53:37 AM   CARE MANAGEMENT NOTE 01/05/2012  Patient:  Courtney Simmons,Courtney Simmons   Account Number:  [id number]  Date Initiated:  01/02/2012  Documentation initiated by:  Sharrie Rothman  Subjective/Objective Assessment:   Pt admitted from home s/p ORIF of left ankle fracture. Pt lives with significant other and is fairly independent with ADL's.     Action/Plan:   CM attempted to speak with pt but pt was in surgery. Will continue to follow for any HH or DME needs.   Anticipated DC Date:  01/03/2012   Anticipated DC Plan:  HOME/SELF CARE      DC Planning Services  CM consult      [facility] Choice  HOME HEALTH  DURABLE MEDICAL EQUIPMENT   Choice offered to / List presented to:  C-1 Patient   DME arranged  WALKER - ROLLING  3-N-1      DME agency  [facility]     HH arranged  HH-2 PT  HH-3 OT      [facility] agency  [facility]   Status of service:  Completed, signed off Medicare Important Message given?   (If response is "NO", the following Medicare IM given date fields will be blank) Date Medicare IM given:   Date Additional Medicare IM given:    Discharge Disposition:  HOME W HOME HEALTH SERVICES  Per UR Regulation:    If discussed at Long Length of Stay Meetings, dates discussed:    Comments:  01/05/12 1048 Arlyss Queen, RN BSN CM Pt discharged home today for [facility] PT and OT. Alroy Bailiff of [facility] is aware and will collect the pts information from the chart. Pt also requested DME from [facility]. Rolling walker will be delivered to pts room prior to discharge and the family is going to pick up the 3 N 1 from the [facility] store. Pt is going to borrow a wheelchair from a family friend. HH services will start within 48 hours of discharge. Pt and pts nurse aware of pts discharge arrangements.  01/02/12 1518 Arlyss Queen, RN BSN CM

## 2012-01-05 NOTE — Progress Notes (Signed)
Subjective: 3 Days Post-Op Procedure(s) (LRB): OPEN REDUCTION INTERNAL FIXATION (ORIF) ANKLE FRACTURE (Left) Patient reports pain as 1 on 0-10 scale.    Objective: Vital signs in last 24 hours: Temp:  [98.4 F (36.9 C)-98.6 F (37 C)] 98.6 F (37 C) (07/29 0544) Pulse Rate:  [76-95] 76  (07/29 0544) Resp:  [16-20] 18  (07/29 0544) BP: (115-116)/(76-79) 116/79 mmHg (07/29 0544) SpO2:  [95 %-97 %] 95 % (07/29 0544)  Intake/Output from previous day: 07/28 0701 - 07/29 0700 In: 600 [P.O.:600] Out: 1100 [Urine:1100] Intake/Output this shift:     Basename 01/03/12 0515  HGB 11.4*    Basename 01/03/12 0515  WBC 10.1  RBC 3.85*  HCT 35.6*  PLT 290    Basename 01/05/12 0452 01/04/12 0446  NA 141 137  K 3.7 3.7  CL 105 102  CO2 28 28  BUN 11 8  CREATININE 0.71 0.63  GLUCOSE 103* 130*  CALCIUM 9.3 9.0   No results found for this basename: LABPT:2,INR:2 in the last 72 hours  Neurologically intact Neurovascular intact Sensation intact distally Intact pulses distally  Did well in PT.  To discharge home today. Assessment/Plan: 3 Days Post-Op Procedure(s) (LRB): OPEN REDUCTION INTERNAL FIXATION (ORIF) ANKLE FRACTURE (Left) Discharge home with home health  Courtney Simmons 01/05/2012, 7:32 AM

## 2012-01-05 NOTE — Discharge Summary (Signed)
Physician Discharge Summary  Patient ID: Courtney Simmons MRN: [id number] DOB/AGE: 55-Jun-1957 55 y.o.  Admit date: 01/01/2012 Discharge date: 01/05/2012  Admission Diagnoses: Bimalleolar fracture with dislocation of the left ankle  Discharge Diagnoses: Bimalleolar fracture with dislocation of the left ankle Active Problems:  Trimalleolar fracture of left ankle  Hypothyroidism  Morbid obesity   Discharged Condition: good  Hospital Course: She fell at home and hurt the left ankle.  She was brought to the emergency room.  She had a dislocation of the left ankle and fracture.  The dislocation was reduced.  She was admitted to the hospital and later that day had surgery on the ankle.  She tolerated it well. She had no other injury.  She remained afebrile during her stay and labs remained normal.  She was seen by physical therapy and made good progress. She will continue toe touch weight bearing at home with being seen by home health.  Her pain levels were controlled well.    Consults: None  Significant Diagnostic Studies: labs: which were normal and radiology: xrays showing fracture dislocation of the ankle with reduction and then surgery and proper positioning of the fractures.  Treatments: surgery: open fixation internal reduction of the left ankle fracture  Discharge Exam: Blood pressure 116/79, pulse 76, temperature 98.6 F (37 C), temperature source Oral, resp. rate 18, height 5\' 7"  (1.702 m), weight 122.29 kg (269 lb 9.6 oz), SpO2 95.00%. At time of discharge she is doing well. She is alert and cooperative.  The splint on the left lower leg is in good condition and neurovascular is intact. She is able to get out of bed and move about.  Disposition: Final discharge disposition not confirmed  Discharge Orders    Future Orders Please Complete By Expires   Diet - low sodium heart healthy      Call MD / Call 911      Comments:   If you experience chest pain or shortness of breath,  CALL 911 and be transported to the hospital emergency room.  If you develope a fever above 101 F, pus (white drainage) or increased drainage or redness at the wound, or calf pain, call your surgeon's office.   Constipation Prevention      Comments:   Drink plenty of fluids.  Prune juice may be helpful.  You may use a stool softener, such as Colace (over the counter) 100 mg twice a day.  Use MiraLax (over the counter) for constipation as needed.   Increase activity slowly as tolerated      Discharge instructions      Comments:   Elevate left foot.  Toe touch weight bearing only.  Keep cast dry.    See Dr. Hilda Lias in office in two weeks. Call for appointment at [phone].  Call if any problem or hospital after hours, [phone].     Medication List  As of 01/05/2012  7:42 AM   TAKE these medications         B-12 PO   Take 1 tablet by mouth every morning.      cholecalciferol 400 UNITS Tabs   Commonly known as: VITAMIN D   Take 400 Units by mouth every morning.      levothyroxine 50 MCG tablet   Commonly known as: SYNTHROID, LEVOTHROID   Take 50 mcg by mouth every morning.      oxyCODONE-acetaminophen 7.5-325 MG per tablet   Commonly known as: PERCOCET   Take 1 tablet by mouth every  4 (four) hours as needed for pain.           Follow-up Information    Follow up with Darreld Mclean, MD in 2 weeks.   Contact information:   [address] [place] [address] [phone]          Signed: Darreld Mclean 01/05/2012, 7:42 AM

## 2012-01-05 NOTE — Progress Notes (Signed)
D/c instructions reviewed with patient and family.  Verbalized understanding. Pt d/c'd to home with family.  Schonewitz, Thelmer Legler Anne 01/05/2012  

## 2012-01-06 ENCOUNTER — Encounter (HOSPITAL_COMMUNITY): Payer: Self-pay | Admitting: Orthopaedic Surgery

## 2012-03-16 ENCOUNTER — Ambulatory Visit: Payer: 59 | Attending: Orthopaedic Surgery | Admitting: Physical Therapy

## 2012-03-16 DIAGNOSIS — IMO0001 Reserved for inherently not codable concepts without codable children: Secondary | ICD-10-CM | POA: Insufficient documentation

## 2012-03-16 DIAGNOSIS — M25673 Stiffness of unspecified ankle, not elsewhere classified: Secondary | ICD-10-CM | POA: Insufficient documentation

## 2012-03-16 DIAGNOSIS — R262 Difficulty in walking, not elsewhere classified: Secondary | ICD-10-CM | POA: Insufficient documentation

## 2012-03-16 DIAGNOSIS — R5381 Other malaise: Secondary | ICD-10-CM | POA: Insufficient documentation

## 2012-03-16 DIAGNOSIS — M25676 Stiffness of unspecified foot, not elsewhere classified: Secondary | ICD-10-CM | POA: Insufficient documentation

## 2012-03-16 DIAGNOSIS — M25579 Pain in unspecified ankle and joints of unspecified foot: Secondary | ICD-10-CM | POA: Insufficient documentation

## 2012-03-18 ENCOUNTER — Ambulatory Visit: Payer: 59 | Admitting: Physical Therapy

## 2012-03-22 ENCOUNTER — Ambulatory Visit: Payer: 59 | Admitting: Physical Therapy

## 2012-03-23 ENCOUNTER — Ambulatory Visit: Payer: 59 | Admitting: Physical Therapy

## 2012-03-24 ENCOUNTER — Encounter: Payer: 59 | Admitting: Physical Therapy

## 2012-03-26 ENCOUNTER — Ambulatory Visit: Payer: 59 | Admitting: Physical Therapy

## 2012-03-30 ENCOUNTER — Ambulatory Visit: Payer: 59 | Admitting: Physical Therapy

## 2012-04-01 ENCOUNTER — Ambulatory Visit: Payer: 59 | Admitting: Physical Therapy

## 2012-04-02 ENCOUNTER — Ambulatory Visit: Payer: 59 | Admitting: *Deleted

## 2012-04-05 ENCOUNTER — Ambulatory Visit: Payer: 59 | Admitting: Physical Therapy

## 2012-04-07 ENCOUNTER — Ambulatory Visit: Payer: 59 | Admitting: Physical Therapy

## 2012-04-09 ENCOUNTER — Ambulatory Visit: Payer: 59 | Attending: Orthopaedic Surgery | Admitting: Physical Therapy

## 2012-04-09 DIAGNOSIS — R5381 Other malaise: Secondary | ICD-10-CM | POA: Insufficient documentation

## 2012-04-09 DIAGNOSIS — IMO0001 Reserved for inherently not codable concepts without codable children: Secondary | ICD-10-CM | POA: Insufficient documentation

## 2012-04-09 DIAGNOSIS — M25579 Pain in unspecified ankle and joints of unspecified foot: Secondary | ICD-10-CM | POA: Insufficient documentation

## 2012-04-09 DIAGNOSIS — M25673 Stiffness of unspecified ankle, not elsewhere classified: Secondary | ICD-10-CM | POA: Insufficient documentation

## 2012-04-09 DIAGNOSIS — R262 Difficulty in walking, not elsewhere classified: Secondary | ICD-10-CM | POA: Insufficient documentation

## 2012-04-09 DIAGNOSIS — M25676 Stiffness of unspecified foot, not elsewhere classified: Secondary | ICD-10-CM | POA: Insufficient documentation

## 2012-04-13 ENCOUNTER — Ambulatory Visit: Payer: 59 | Admitting: Physical Therapy

## 2012-06-18 ENCOUNTER — Encounter: Payer: Self-pay | Admitting: Gastroenterology

## 2012-07-07 ENCOUNTER — Ambulatory Visit (AMBULATORY_SURGERY_CENTER): Payer: 59 | Admitting: *Deleted

## 2012-07-07 VITALS — Ht 66.0 in | Wt 269.2 lb

## 2012-07-07 DIAGNOSIS — Z1211 Encounter for screening for malignant neoplasm of colon: Secondary | ICD-10-CM

## 2012-07-07 DIAGNOSIS — K921 Melena: Secondary | ICD-10-CM

## 2012-07-07 MED ORDER — MOVIPREP 100 G PO SOLR: 1.0000 | Freq: Once | ORAL | Status: DC

## 2012-07-08 ENCOUNTER — Encounter: Payer: Self-pay | Admitting: Gastroenterology

## 2012-07-09 ENCOUNTER — Ambulatory Visit: Payer: 59 | Admitting: Emergency Medicine

## 2012-07-21 ENCOUNTER — Encounter: Payer: 59 | Admitting: Gastroenterology

## 2012-07-23 ENCOUNTER — Encounter: Payer: 59 | Admitting: Gastroenterology

## 2012-08-20 ENCOUNTER — Other Ambulatory Visit: Payer: Self-pay | Admitting: *Deleted

## 2012-08-20 ENCOUNTER — Ambulatory Visit (AMBULATORY_SURGERY_CENTER): Payer: 59 | Admitting: Gastroenterology

## 2012-08-20 VITALS — BP 133/77 | HR 78 | Temp 98.2°F | Resp 19 | Ht 66.0 in | Wt 269.0 lb

## 2012-08-20 DIAGNOSIS — Z1211 Encounter for screening for malignant neoplasm of colon: Secondary | ICD-10-CM

## 2012-08-20 DIAGNOSIS — R195 Other fecal abnormalities: Secondary | ICD-10-CM

## 2012-08-20 MED ORDER — SODIUM CHLORIDE 0.9 % IV SOLN: 500.0000 mL | INTRAVENOUS | Status: DC

## 2012-08-20 NOTE — Op Note (Signed)
[facility] [address] [place], [address]   COLONOSCOPY PROCEDURE REPORT  PATIENT: Courtney Simmons, Courtney Simmons  MR#: [id number] BIRTHDATE: [birth year] , 56  yrs. old GENDER: Female ENDOSCOPIST: Mardella Layman, MD, [facility] REFERRED BY:  Wardell Heath, N.P. PROCEDURE DATE:  08/20/2012 PROCEDURE:   Colonoscopy, screening ASA CLASS:   Class II INDICATIONS:Average risk patient for colon cancer. MEDICATIONS: propofol (Diprivan) 300mg  IV  DESCRIPTION OF PROCEDURE:   After the risks and benefits and of the procedure were explained, informed consent was obtained.  A digital rectal exam revealed no abnormalities of the rectum.    The LB PCF-H180AL [id number] and LB CF-H180AL [id number]  endoscope was introduced through the anus and advanced to the cecum, which was identified by both the appendix and ileocecal valve .  The quality of the prep was excellent, using MoviPrep .  The instrument was then slowly withdrawn as the colon was fully examined.     COLON FINDINGS: A normal appearing cecum, ileocecal valve, and appendiceal orifice were identified.  The ascending, hepatic flexure, transverse, splenic flexure, descending, sigmoid colon and rectum appeared unremarkable.  No polyps or cancers were seen. Retroflexed views revealed no abnormalities.     The scope was then withdrawn from the patient and the procedure completed.  COMPLICATIONS: There were no complications. ENDOSCOPIC IMPRESSION:no mucosal/ polypoid lesions noted. Normal colon  RECOMMENDATIONS: Continue current colorectal screening recommendations for "routine risk" patients with a repeat colonoscopy in 10 years.   REPEAT EXAM:  cc:  _______________________________ eSignedMardella Layman, MD, [facility] 08/20/2012 2:55 PM

## 2012-08-20 NOTE — Progress Notes (Signed)
Patient did not experience any of the following events: a burn prior to discharge; a fall within the facility; wrong site/side/patient/procedure/implant event; or a hospital transfer or hospital admission upon discharge from the facility. (G8907) Patient did not have preoperative order for IV antibiotic SSI prophylaxis. (G8918)  

## 2012-08-20 NOTE — Patient Instructions (Addendum)

## 2012-08-23 ENCOUNTER — Telehealth: Payer: Self-pay | Admitting: *Deleted

## 2012-08-23 NOTE — Telephone Encounter (Signed)
  Follow up Call-  Call back number 08/20/2012  Post procedure Call Back phone  # [phone]  Permission to leave phone message Yes     Patient questions:  Do you have a fever, pain , or abdominal swelling? no Pain Score  0 *  Have you tolerated food without any problems? yes  Have you been able to return to your normal activities? yes  Do you have any questions about your discharge instructions: Diet   no Medications  no Follow up visit  no  Do you have questions or concerns about your Care? no  Actions: * If pain score is 4 or above: No action needed, pain <4.

## 2012-08-27 ENCOUNTER — Other Ambulatory Visit (INDEPENDENT_AMBULATORY_CARE_PROVIDER_SITE_OTHER): Payer: 59

## 2012-08-27 DIAGNOSIS — R195 Other fecal abnormalities: Secondary | ICD-10-CM

## 2012-08-27 LAB — FECAL OCCULT BLOOD, IMMUNOCHEMICAL: Fecal Occult Bld: NEGATIVE

## 2012-09-20 ENCOUNTER — Other Ambulatory Visit: Payer: Self-pay

## 2012-09-20 MED ORDER — SIMVASTATIN 40 MG PO TABS: 40.0000 mg | ORAL_TABLET | Freq: Every day | ORAL | Status: DC

## 2012-09-20 MED ORDER — LEVOTHYROXINE SODIUM 75 MCG PO TABS: 75.0000 ug | ORAL_TABLET | ORAL | Status: DC

## 2012-09-20 NOTE — Telephone Encounter (Signed)
Last thyroid labs 9/13  Last lipids 12/13  Last seen 05/28/12    Print and have nurse call patient to pick up for mail order

## 2012-10-11 ENCOUNTER — Other Ambulatory Visit: Payer: Self-pay

## 2012-10-11 DIAGNOSIS — Z1231 Encounter for screening mammogram for malignant neoplasm of breast: Secondary | ICD-10-CM

## 2012-11-15 ENCOUNTER — Ambulatory Visit
Admission: RE | Admit: 2012-11-15 | Discharge: 2012-11-15 | Disposition: A | Discharge status: Home / Self Care | Payer: 59 | Source: Ambulatory Visit

## 2012-11-15 DIAGNOSIS — Z1231 Encounter for screening mammogram for malignant neoplasm of breast: Secondary | ICD-10-CM

## 2012-11-16 NOTE — Progress Notes (Signed)
Pt aware of mamm

## 2012-11-19 ENCOUNTER — Ambulatory Visit (INDEPENDENT_AMBULATORY_CARE_PROVIDER_SITE_OTHER): Payer: 59 | Admitting: Nurse Practitioner

## 2012-11-19 ENCOUNTER — Encounter: Payer: Self-pay | Admitting: Nurse Practitioner

## 2012-11-19 VITALS — BP 132/77 | HR 86 | Temp 97.9°F | Ht 66.0 in | Wt 271.0 lb

## 2012-11-19 DIAGNOSIS — Z0000 Encounter for general adult medical examination without abnormal findings: Secondary | ICD-10-CM

## 2012-11-19 DIAGNOSIS — E785 Hyperlipidemia, unspecified: Secondary | ICD-10-CM

## 2012-11-19 DIAGNOSIS — Z01419 Encounter for gynecological examination (general) (routine) without abnormal findings: Secondary | ICD-10-CM

## 2012-11-19 DIAGNOSIS — E039 Hypothyroidism, unspecified: Secondary | ICD-10-CM

## 2012-11-19 LAB — POCT URINALYSIS DIPSTICK
Bilirubin, UA: NEGATIVE
Glucose, UA: NEGATIVE
Ketones, UA: NEGATIVE
Leukocytes, UA: NEGATIVE
Nitrite, UA: NEGATIVE
Spec Grav, UA: 1.02
Urobilinogen, UA: NEGATIVE
pH, UA: 5

## 2012-11-19 LAB — POCT UA - MICROSCOPIC ONLY
Casts, Ur, LPF, POC: NEGATIVE
Crystals, Ur, HPF, POC: NEGATIVE
WBC, Ur, HPF, POC: NEGATIVE
Yeast, UA: NEGATIVE

## 2012-11-19 LAB — POCT CBC
Granulocyte percent: 68.2 %G (ref 37–80)
HCT, POC: 39.7 % (ref 37.7–47.9)
Hemoglobin: 13.5 g/dL (ref 12.2–16.2)
Lymph, poc: 3.3 (ref 0.6–3.4)
MCH, POC: 29.2 pg (ref 27–31.2)
MCHC: 34.1 g/dL (ref 31.8–35.4)
MCV: 85.5 fL (ref 80–97)
MPV: 7.9 fL (ref 0–99.8)
POC Granulocyte: 7.8 — AB (ref 2–6.9)
POC LYMPH PERCENT: 28.7 %L (ref 10–50)
Platelet Count, POC: 318 10*3/uL (ref 142–424)
RBC: 4.6 M/uL (ref 4.04–5.48)
RDW, POC: 14.1 %
WBC: 11.5 10*3/uL — AB (ref 4.6–10.2)

## 2012-11-19 MED ORDER — ROSUVASTATIN CALCIUM 10 MG PO TABS: 10.0000 mg | ORAL_TABLET | Freq: Every day | ORAL | Status: DC

## 2012-11-19 MED ORDER — LEVOTHYROXINE SODIUM 75 MCG PO TABS: 75.0000 ug | ORAL_TABLET | ORAL | Status: DC

## 2012-11-19 NOTE — Progress Notes (Signed)
Subjective:    Patient ID: Courtney Simmons, female    DOB: [birth year], 57 y.o.   MRN: [id number]  Hyperlipidemia This is a chronic problem. The current episode started more than 1 year ago. The problem is uncontrolled. Recent lipid tests were reviewed and are high. Exacerbating diseases include hypothyroidism and obesity. There are no known factors aggravating her hyperlipidemia. Pertinent negatives include no focal sensory loss. Current antihyperlipidemic treatment includes statins. The current treatment provides moderate improvement of lipids. Compliance problems include adherence to diet and adherence to exercise.  Risk factors for coronary artery disease include obesity and post-menopausal.  Thyroid Problem Presents for follow-up (hypothyroidism) visit. Patient reports no anxiety, constipation, depressed mood, diaphoresis, diarrhea, fatigue, heat intolerance, hoarse voice, leg swelling, nail problem, palpitations, visual change, weight gain or weight loss. Her past medical history is significant for hyperlipidemia.      Review of Systems  Constitutional: Negative for weight loss, weight gain, diaphoresis and fatigue.  HENT: Negative for hoarse voice.   Cardiovascular: Negative for palpitations.  Gastrointestinal: Negative for diarrhea and constipation.  Endocrine: Negative for heat intolerance.  All other systems reviewed and are negative.       Objective:   Physical Exam  Constitutional: She is oriented to person, place, and time. She appears well-developed and well-nourished.  HENT:  Head: Normocephalic.  Right Ear: Hearing, tympanic membrane, external ear and ear canal normal.  Left Ear: Hearing, tympanic membrane, external ear and ear canal normal.  Nose: Nose normal.  Mouth/Throat: Uvula is midline and oropharynx is clear and moist.  Eyes: Conjunctivae and EOM are normal. Pupils are equal, round, and reactive to light.  Neck: Normal range of motion and full passive range  of motion without pain. Neck supple. No JVD present. Carotid bruit is not present. No mass and no thyromegaly present.  Cardiovascular: Normal rate, normal heart sounds and intact distal pulses.   No murmur heard. Pulmonary/Chest: Effort normal and breath sounds normal. Right breast exhibits no inverted nipple, no mass, no nipple discharge, no skin change and no tenderness. Left breast exhibits no inverted nipple, no mass, no nipple discharge, no skin change and no tenderness.  Abdominal: Soft. Bowel sounds are normal. She exhibits no mass. There is no tenderness.  Genitourinary: Vagina normal and uterus normal. No breast swelling, tenderness, discharge or bleeding.  bimanual exam-No adnexal masses or tenderness.  Cervix parous and pink no discharge Uterine prolapse- station 1   Musculoskeletal: Normal range of motion.  Lymphadenopathy:    She has no cervical adenopathy.  Neurological: She is alert and oriented to person, place, and time.  Skin: Skin is warm and dry.  Psychiatric: She has a normal mood and affect. Her behavior is normal. Judgment and thought content normal.     BP 132/77  Pulse 86  Temp(Src) 97.9 F (36.6 C) (Oral)  Ht 5\' 6"  (1.676 m)  Wt 271 lb (122.925 kg)  BMI 43.76 kg/m2       Assessment & Plan:  1. Physical exam Labs pending - POCT UA - Microscopic Only - POCT urinalysis dipstick - POCT CBC - COMPLETE METABOLIC PANEL WITH GFR  2. Encounter for routine gynecological examination  - Pap IG w/ reflex to HPV when ASC-U  3. Hypothyroidism  - levothyroxine (SYNTHROID, LEVOTHROID) 75 MCG tablet; Take 1 tablet (75 mcg total) by mouth every morning.  Dispense: 30 tablet; Refill: 5 - Thyroid Panel With TSH  4. Hyperlipidemia Low fat diet and exercise encouraged - rosuvastatin (CRESTOR) 10  MG tablet; Take 1 tablet (10 mg total) by mouth daily.  Dispense: 30 tablet; Refill: 5 - NMR Lipoprofile with Lipids  Mary-Margaret Daphine Deutscher, FNP

## 2012-11-19 NOTE — Patient Instructions (Addendum)

## 2012-11-20 LAB — COMPLETE METABOLIC PANEL WITH GFR
ALT: 27 U/L (ref 0–35)
AST: 25 U/L (ref 0–37)
Albumin: 4.4 g/dL (ref 3.5–5.2)
Alkaline Phosphatase: 113 U/L (ref 39–117)
BUN: 15 mg/dL (ref 6–23)
CO2: 26 mEq/L (ref 19–32)
Calcium: 9.9 mg/dL (ref 8.4–10.5)
Chloride: 105 mEq/L (ref 96–112)
Creat: 0.73 mg/dL (ref 0.50–1.10)
GFR, Est African American: >89 mL/min
GFR, Est Non African American: >89 mL/min
Glucose, Bld: 84 mg/dL (ref 70–99)
Potassium: 4.2 mEq/L (ref 3.5–5.3)
Sodium: 140 mEq/L (ref 135–145)
Total Bilirubin: 0.7 mg/dL (ref 0.3–1.2)
Total Protein: 7.8 g/dL (ref 6.0–8.3)

## 2012-11-20 LAB — THYROID PANEL WITH TSH
Free Thyroxine Index: 3.4 (ref 1.0–3.9)
T3 Uptake: 29.1 % (ref 22.5–37.0)
T4, Total: 11.7 ug/dL (ref 5.0–12.5)
TSH: 1.632 u[IU]/mL (ref 0.350–4.500)

## 2012-11-22 LAB — NMR LIPOPROFILE WITH LIPIDS
Cholesterol, Total: 130 mg/dL (ref <200)
HDL Particle Number: 29.9 umol/L — ABNORMAL LOW (ref >=30.5)
HDL Size: 8.5 nm — ABNORMAL LOW (ref >=9.2)
HDL-C: 43 mg/dL (ref >=40)
LDL (calc): 65 mg/dL (ref <100)
LDL Particle Number: 1246 nmol/L — ABNORMAL HIGH (ref <1000)
LDL Size: 20.1 nm — ABNORMAL LOW (ref >20.5)
LP-IR Score: 85 — ABNORMAL HIGH (ref <=45)
Large HDL-P: 2 umol/L — ABNORMAL LOW (ref >=4.8)
Large VLDL-P: 6.5 nmol/L — ABNORMAL HIGH (ref <=2.7)
Small LDL Particle Number: 853 nmol/L — ABNORMAL HIGH (ref <=527)
Triglycerides: 109 mg/dL (ref <150)
VLDL Size: 56.2 nm — ABNORMAL HIGH (ref <=46.6)

## 2012-11-22 LAB — PAP IG W/ RFLX HPV ASCU

## 2013-01-12 ENCOUNTER — Encounter (HOSPITAL_COMMUNITY): Payer: Self-pay | Admitting: Emergency Medicine

## 2013-01-12 ENCOUNTER — Encounter (HOSPITAL_COMMUNITY): Payer: Self-pay | Admitting: Adult Health

## 2013-01-12 ENCOUNTER — Emergency Department (HOSPITAL_COMMUNITY)
Admission: EM | Admit: 2013-01-12 | Discharge: 2013-01-12 | Disposition: A | Discharge status: Home Health | Payer: 59 | Attending: Emergency Medicine | Admitting: Emergency Medicine

## 2013-01-12 ENCOUNTER — Emergency Department (HOSPITAL_COMMUNITY)
Admission: EM | Admit: 2013-01-12 | Discharge: 2013-01-12 | Disposition: A | Discharge status: Nursing Home (Includes ICF) | Payer: 59 | Source: Home / Self Care

## 2013-01-12 DIAGNOSIS — E669 Obesity, unspecified: Secondary | ICD-10-CM | POA: Insufficient documentation

## 2013-01-12 DIAGNOSIS — I82402 Acute embolism and thrombosis of unspecified deep veins of left lower extremity: Secondary | ICD-10-CM

## 2013-01-12 DIAGNOSIS — I82409 Acute embolism and thrombosis of unspecified deep veins of unspecified lower extremity: Secondary | ICD-10-CM

## 2013-01-12 DIAGNOSIS — E039 Hypothyroidism, unspecified: Secondary | ICD-10-CM | POA: Insufficient documentation

## 2013-01-12 DIAGNOSIS — E785 Hyperlipidemia, unspecified: Secondary | ICD-10-CM | POA: Insufficient documentation

## 2013-01-12 DIAGNOSIS — I80292 Phlebitis and thrombophlebitis of other deep vessels of left lower extremity: Secondary | ICD-10-CM

## 2013-01-12 DIAGNOSIS — Z79899 Other long term (current) drug therapy: Secondary | ICD-10-CM | POA: Insufficient documentation

## 2013-01-12 DIAGNOSIS — I803 Phlebitis and thrombophlebitis of lower extremities, unspecified: Secondary | ICD-10-CM | POA: Insufficient documentation

## 2013-01-12 DIAGNOSIS — Z87442 Personal history of urinary calculi: Secondary | ICD-10-CM | POA: Insufficient documentation

## 2013-01-12 DIAGNOSIS — M79609 Pain in unspecified limb: Secondary | ICD-10-CM

## 2013-01-12 MED ORDER — CEPHALEXIN 500 MG PO CAPS: 500.0000 mg | ORAL_CAPSULE | Freq: Four times a day (QID) | ORAL | Status: DC

## 2013-01-12 MED ORDER — IBUPROFEN 200 MG PO TABS: 400.0000 mg | ORAL_TABLET | Freq: Four times a day (QID) | ORAL | Status: DC | PRN

## 2013-01-12 NOTE — ED Provider Notes (Signed)
History  This chart was scribed for non-physician practitioner, Fayrene Helper PA-C, working with Juliet Rude. Rubin Payor, MD by Ardeen Jourdain, ED Scribe. This patient was seen in room TR11C/TR11C and the patient's care was started at 1931.  CSN: [id number]     Arrival date & time 01/12/13  1829  First MD Initiated Contact with Patient 01/12/13 1931     Chief Complaint  Patient presents with  . Leg Pain    The history is provided by the patient. No language interpreter was used.    HPI Comments: Courtney Simmons is a 57 y.o. female who presents to the Emergency Department complaining of gradual onset, gradually worsening, constant left lower leg pain with associated redness and swelling. Pt states the symptoms began 3 weeks ago. She rates the pain currently at a 6/10. She states her pain is an 8/10 at the worst. She describes the pain as a achy, burning and throbbing pain. Pt states the pain is aggravated by lying down and ice. Pt states the pain is relieved by walking and heat. She is able to ambulate. She denies any fever, nausea, emesis or rash as associated symptoms.    Past Medical History  Diagnosis Date  . Hypothyroidism   . Obesity   . Morbid obesity 01/02/2012  . Hyperlipidemia   . Kidney stone    Past Surgical History  Procedure Laterality Date  . Back surgery  1992    no prolems with surgery  . Orif ankle fracture  01/02/2012    Procedure: OPEN REDUCTION INTERNAL FIXATION (ORIF) ANKLE FRACTURE;  Surgeon: Darreld Mclean, MD;  Location: [facility];  Service: Orthopedics;  Laterality: Left;  . Cyst removed from throat  1980s   Family History  Problem Relation Age of Onset  . Heart attack Mother 61    died 72  . Hypertension Mother   . Diabetes Mother   . Gout Father   . Coronary artery disease Father   . Diabetes Sister   . Stroke Sister   . Coronary artery disease Sister     died age 91  . Obesity Sister   . Cancer Brother     died age 61  . Stroke Brother 68  . Colon  cancer Neg Hx   . Esophageal cancer Neg Hx   . Rectal cancer Neg Hx   . Stomach cancer Neg Hx    History  Substance Use Topics  . Smoking status: Never Smoker   . Smokeless tobacco: Not on file  . Alcohol Use: No   No OB history available.   Review of Systems  Cardiovascular: Positive for leg swelling.  Musculoskeletal:       Left lower leg pain   All other systems reviewed and are negative.    Allergies  Review of patient's allergies indicates no known allergies.  Home Medications   Current Outpatient Rx  Name  Route  Sig  Dispense  Refill  . aspirin EC 325 MG tablet   Oral   Take 325 mg by mouth daily as needed for pain.         Marland Kitchen ibuprofen (ADVIL,MOTRIN) 200 MG tablet   Oral   Take 400 mg by mouth every 6 (six) hours as needed for pain.         Marland Kitchen levothyroxine (SYNTHROID, LEVOTHROID) 75 MCG tablet   Oral   Take 75 mcg by mouth daily before breakfast.         . oxyCODONE-acetaminophen (PERCOCET/ROXICET) 5-325 MG per  tablet   Oral   Take 1 tablet by mouth every 4 (four) hours as needed for pain.         . rosuvastatin (CRESTOR) 10 MG tablet   Oral   Take 10 mg by mouth daily.          Triage Vitals: BP 167/85  Pulse 100  Temp(Src) 97.7 F (36.5 C) (Oral)  Resp 16  SpO2 98%  Physical Exam  Nursing note and vitals reviewed. Constitutional: She is oriented to person, place, and time. She appears well-developed and well-nourished. No distress.  HENT:  Head: Normocephalic and atraumatic.  Eyes: EOM are normal. Pupils are equal, round, and reactive to light.  Neck: Normal range of motion. Neck supple. No tracheal deviation present.  Cardiovascular: Normal rate.   Pulmonary/Chest: Effort normal. No respiratory distress.  Abdominal: Soft. She exhibits no distension.  Musculoskeletal: Normal range of motion. She exhibits no edema.  Area of erythema 4.5 cm in diameter to left lower leg. mild induration and warmth. No palpable cords. TTP. No  pustular, vesicular or petechial rash   Neurological: She is alert and oriented to person, place, and time.  Skin: Skin is warm and dry.  Psychiatric: She has a normal mood and affect. Her behavior is normal.    ED Course   Procedures (including critical care time)  DIAGNOSTIC STUDIES: Oxygen Saturation is 98% on room air, normal by my interpretation.    COORDINATION OF CARE:  7:44 PM-Discussed treatment plan which includes antibiotics and instructions for home care with pt at bedside and pt agreed to plan.   7:51 PM Negative DVT on [place].  Likely thrombophlebitis.  Encourage warm compress.  Will also prescribe keflex for suspect cellulitis.  Return precaution given.   [place], Carol Female [birth year] [id number]            Progress Notes signed by Glendale Chard at 01/12/2013 7:17 PM    Author: Glendale Chard Service: Vascular Lab Author Type: Cardiovascular Sonographer   Filed: 01/12/2013 7:17 PM Note Time: 01/12/2013 7:16 PM         *PRELIMINARY RESULTS*  Vascular Ultrasound  Left lower extremity venous duplex has been completed. Preliminary findings: negative for DVT and baker's cyst.  Gave prelim results to RN.  Farrel Demark, RDMS, RVT  01/12/2013, 7:16 PM      Labs Reviewed - No data to display No results found. 1. Thrombophlebitis of left leg     MDM  BP 167/85  Pulse 100  Temp(Src) 97.7 F (36.5 C) (Oral)  Resp 16  SpO2 98%  I personally performed the services described in this documentation, which was scribed in my presence. The recorded information has been reviewed and is accurate.     Fayrene Helper, PA-C 01/12/13 1953

## 2013-01-12 NOTE — ED Notes (Signed)
Presents with left lower leg pain that began 3 weeks ago with redness and swelling.  Left leg swollen, red area to left calf, pain is worse when lying down better with walking. Cms intact

## 2013-01-12 NOTE — ED Notes (Signed)
Pt c/o left leg edema onset 3 weeks.... sxs include: localized fever, pain that increases when she is lying down or resting... Pain alleviated when she walks... Alert w/no signs of acute distress.

## 2013-01-12 NOTE — Progress Notes (Signed)
*  PRELIMINARY RESULTS* Vascular Ultrasound Left lower extremity venous duplex  has been completed.  Preliminary findings: negative for DVT and baker's cyst.  Gave prelim results to RN.   Farrel Demark, RDMS, RVT  01/12/2013, 7:16 PM

## 2013-01-12 NOTE — ED Provider Notes (Signed)
CSN: [id number]     Arrival date & time 01/12/13  1650 History     None    Chief Complaint  Patient presents with  . Leg Swelling   (Consider location/radiation/quality/duration/timing/severity/associated sxs/prior Treatment) Patient is a 57 y.o. female presenting with leg pain. The history is provided by the patient.  Leg Pain Location:  Leg Injury: no   Leg location:  L lower leg Pain details:    Quality:  Throbbing   Radiates to:  Does not radiate   Severity:  Moderate   Progression:  Worsening Chronicity:  Recurrent (onset 3wk ago improved but then relapsed worse) Dislocation: no   Associated symptoms: swelling   Risk factors: obesity   Risk factors comment:  S/p ankle fx left.   Past Medical History  Diagnosis Date  . Hypothyroidism   . Obesity   . Morbid obesity 01/02/2012  . Hyperlipidemia   . Kidney stone    Past Surgical History  Procedure Laterality Date  . Back surgery  1992    no prolems with surgery  . Orif ankle fracture  01/02/2012    Procedure: OPEN REDUCTION INTERNAL FIXATION (ORIF) ANKLE FRACTURE;  Surgeon: Darreld Mclean, MD;  Location: [facility];  Service: Orthopedics;  Laterality: Left;  . Cyst removed from throat  1980s   Family History  Problem Relation Age of Onset  . Heart attack Mother 15    died 56  . Hypertension Mother   . Diabetes Mother   . Gout Father   . Coronary artery disease Father   . Diabetes Sister   . Stroke Sister   . Coronary artery disease Sister     died age 60  . Obesity Sister   . Cancer Brother     died age 65  . Stroke Brother 53  . Colon cancer Neg Hx   . Esophageal cancer Neg Hx   . Rectal cancer Neg Hx   . Stomach cancer Neg Hx    History  Substance Use Topics  . Smoking status: Never Smoker   . Smokeless tobacco: Not on file  . Alcohol Use: No   OB History   Grav Para Term Preterm Abortions TAB SAB Ect Mult Living                 Review of Systems  Constitutional: Negative.   Musculoskeletal:  Positive for myalgias and gait problem.  Skin: Positive for color change.    Allergies  Review of patient's allergies indicates no known allergies.  Home Medications   Current Outpatient Rx  Name  Route  Sig  Dispense  Refill  . levothyroxine (SYNTHROID, LEVOTHROID) 75 MCG tablet   Oral   Take 1 tablet (75 mcg total) by mouth every morning.   30 tablet   5   . rosuvastatin (CRESTOR) 10 MG tablet   Oral   Take 1 tablet (10 mg total) by mouth daily.   30 tablet   5   . cholecalciferol (VITAMIN D) 400 UNITS TABS   Oral   Take 400 Units by mouth every morning.         . simvastatin (ZOCOR) 40 MG tablet   Oral   Take 1 tablet (40 mg total) by mouth at bedtime.   90 tablet   0     NTBS    BP 140/85  Pulse 86  Temp(Src) 98 F (36.7 C) (Oral)  Resp 16  SpO2 100% Physical Exam  Nursing note and vitals reviewed. Constitutional:  She is oriented to person, place, and time. She appears well-developed and well-nourished.  Musculoskeletal: She exhibits edema and tenderness.  Left lower leg swelling , warmth, and erythema and tenderness.  Neurological: She is alert and oriented to person, place, and time.  Skin: Skin is warm. There is erythema.    ED Course   Procedures (including critical care time)  Labs Reviewed - No data to display No results found. 1. DVT (deep venous thrombosis), left     MDM  Sent for eval of dvt and thrombophlebitis left lower leg.  Linna Hoff, MD 01/12/13 [phone]

## 2013-01-13 ENCOUNTER — Telehealth (HOSPITAL_COMMUNITY): Payer: Self-pay | Admitting: Emergency Medicine

## 2013-01-13 NOTE — ED Provider Notes (Signed)
Medical screening examination/treatment/procedure(s) were performed by non-physician practitioner and as supervising physician I was immediately available for consultation/collaboration.  Juliet Rude. Rubin Payor, MD 01/13/13 [phone]

## 2013-01-17 ENCOUNTER — Telehealth: Payer: Self-pay | Admitting: Nurse Practitioner

## 2013-01-17 NOTE — Telephone Encounter (Signed)
appt given for Friday with Paulene Floor. Pt requested Friday appt and pt was told that if her leg got worse to call for sooner appt.

## 2013-01-21 ENCOUNTER — Encounter: Payer: Self-pay | Admitting: Nurse Practitioner

## 2013-01-21 ENCOUNTER — Ambulatory Visit (INDEPENDENT_AMBULATORY_CARE_PROVIDER_SITE_OTHER): Payer: 59 | Admitting: Nurse Practitioner

## 2013-01-21 VITALS — BP 124/73 | HR 88 | Temp 97.9°F | Ht 66.0 in | Wt 280.0 lb

## 2013-01-21 DIAGNOSIS — R609 Edema, unspecified: Secondary | ICD-10-CM

## 2013-01-21 DIAGNOSIS — L02419 Cutaneous abscess of limb, unspecified: Secondary | ICD-10-CM

## 2013-01-21 DIAGNOSIS — L03119 Cellulitis of unspecified part of limb: Secondary | ICD-10-CM

## 2013-01-21 MED ORDER — CIPROFLOXACIN HCL 500 MG PO TABS: 500.0000 mg | ORAL_TABLET | Freq: Two times a day (BID) | ORAL | Status: DC

## 2013-01-21 MED ORDER — FUROSEMIDE 20 MG PO TABS: 20.0000 mg | ORAL_TABLET | Freq: Every day | ORAL | Status: DC

## 2013-01-21 NOTE — Patient Instructions (Signed)

## 2013-01-21 NOTE — Progress Notes (Signed)
  Subjective:    Patient ID: Courtney Simmons, female    DOB: 12/03/1955, 57 y.o.   MRN: [id number]  HPI Patient went to urgent care last Wednesday with left swollen leg- They thought she had a blood clot- Doppler study done which was negative- Then diagnosed with cellulitis and was given keflex- finished fluid pill yesterday- still c/o left leg swelling ad tightness.    Review of Systems  Respiratory: Negative.   Cardiovascular: Positive for leg swelling (left). Negative for chest pain and palpitations.       Objective:   Physical Exam  Constitutional: She appears well-developed and well-nourished.  Cardiovascular: Normal rate, regular rhythm and normal heart sounds.   Pulmonary/Chest: Effort normal and breath sounds normal.  Musculoskeletal: She exhibits edema (2+ left lower leg with tender hot palpable nodule medially mid shin area.).    BP 124/73  Pulse 88  Temp(Src) 97.9 F (36.6 C) (Oral)  Ht 5\' 6"  (1.676 m)  Wt 280 lb (127.007 kg)  BMI 45.21 kg/m2       Assessment & Plan:  1. Cellulitis of lower leg Cool compresses - ciprofloxacin (CIPRO) 500 MG tablet; Take 1 tablet (500 mg total) by mouth 2 (two) times daily.  Dispense: 20 tablet; Refill: 0  2. Peripheral edema Elevate leg when sitting Daily weights- should go down - furosemide (LASIX) 20 MG tablet; Take 1 tablet (20 mg total) by mouth daily.  Dispense: 30 tablet; Refill: 3  Mary-Margaret Daphine Deutscher, FNP

## 2013-01-31 ENCOUNTER — Ambulatory Visit (INDEPENDENT_AMBULATORY_CARE_PROVIDER_SITE_OTHER): Payer: 59 | Admitting: Nurse Practitioner

## 2013-01-31 ENCOUNTER — Ambulatory Visit (INDEPENDENT_AMBULATORY_CARE_PROVIDER_SITE_OTHER): Payer: 59

## 2013-01-31 ENCOUNTER — Encounter: Payer: Self-pay | Admitting: Nurse Practitioner

## 2013-01-31 ENCOUNTER — Telehealth: Payer: Self-pay | Admitting: Nurse Practitioner

## 2013-01-31 VITALS — BP 121/80 | HR 74 | Temp 97.7°F | Ht 66.0 in | Wt 272.0 lb

## 2013-01-31 DIAGNOSIS — M79605 Pain in left leg: Secondary | ICD-10-CM

## 2013-01-31 DIAGNOSIS — M79609 Pain in unspecified limb: Secondary | ICD-10-CM

## 2013-01-31 NOTE — Patient Instructions (Signed)
Deep Vein Thrombosis A deep vein thrombosis (DVT) is a blood clot that develops in a deep vein. A DVT is a clot in the deep, larger veins of the leg, arm, or pelvis. These are more dangerous than clots that might form in veins near the surface of the body. A DVT can lead to complications if the clot breaks off and travels in the bloodstream to the lungs.  A DVT can damage the valves in your leg veins, so that instead of flowing upwards, the blood pools in the lower leg. This is called post-thrombotic syndrome, and can result in pain, swelling, discoloration, and sores on the leg. Once identified, a DVT can be treated. It can also be prevented in some circumstances. Once you have had a DVT, you may be at increased risk for a DVT in the future. CAUSES Blood clots form in a vein for different reasons. Usually several things contribute to blood clots. Contributing factors include:  The flow of blood slows down.  The inside of the vein is damaged in some way.  The person has a condition that makes blood clot more easily. Some people are more likely than others to develop blood clots. That is because they have more factors that make clots likely. These are called risk factors. Risk factors include:   Older age, especially over 75 years old.  Having a history of blood clots. This means you have had one before. Or, it means that someone else in your family has had blood clots. You may have a genetic tendency to form clots.  Having major or lengthy surgery. This is especially true for surgery on the hip, knee, or belly (abdomen). Hip surgery is particularly high risk.  Breaking a hip or leg.  Sitting or lying still for a long time. This includes long distance travel, paralysis, or recovery from an illness or surgery.  Cancer, or cancer treatment.  Having a long, thin tube (catheter) placed inside a vein during a medical procedure.  Being overweight (obese).  Pregnancy and childbirth. Hormone  changes make the blood clot more easily during pregnancy. The fetus puts pressure on the veins of the pelvis. There is also risk of injury to veins during delivery or a caesarean. The risk is at its highest just after childbirth.  Medicines with the female hormone estrogen. This includes birth control pills and hormone replacement therapy.  Smoking.  Other circulation or heart problems. SYMPTOMS When a clot forms, it can either partially or totally block the blood flow in that vein. Symptoms of a DVT can include:  Swelling of the leg or arm, especially if one side is much worse.  Warmth and redness of the leg or arm, especially if one side is much worse.  Pain in an arm or leg. If the clot is in the leg, symptoms may be more noticeable or worse when standing or walking. The symptoms of a DVT that has traveled to the lungs (pulmonary embolism, PE) usually start suddenly, and include:  Shortness of breath.  Coughing.  Coughing up blood or blood-tinged phlegm.  Chest pain. The chest pain is often worse with deep breaths.  Rapid heartbeat. Anyone with these symptoms should get emergency medical treatment right away. Call your local emergency services (911 in [place] if you have these symptoms. DIAGNOSIS If a DVT is suspected, your caregiver will take a full medical history and carry out a physical exam. Tests that also may be required include:  Blood tests, including studies of   the clotting properties of the blood.  Ultrasonography to see if you have clots in your legs or lungs.  X-rays to show the flow of blood when dye is injected into the veins (venography).  Studies of your lungs, if you have any chest symptoms. PREVENTION  Exercise the legs regularly. Take a brisk 30 minute walk every day.  Maintain a weight that is appropriate for your height.  Avoid sitting or lying in bed for long periods of time without moving your legs.  Women, particularly those over the age of 35,  should consider the risks and benefits of taking estrogen medicines, including birth control pills.  Do not smoke, especially if you take estrogen medicines.  Long distance travel can increase your risk of DVT. You should exercise your legs by walking or pumping the muscles every hour.  In-hospital prevention:  Many of the risk factors above relate to situations that exist with hospitalization, either for illness, injury, or elective surgery.  Your caregiver will assess you for the need for venous thromboembolism prophylaxis when you are admitted to the hospital. If you are having surgery, your surgeon will assess you the day of or day after surgery.  Prevention may include medical and nonmedical measures. TREATMENT Treatment for DVT helps prevent death and disability. The most common treatment for DVT is blood thinning (anticoagulant) medicine, which reduces the blood's tendency to clot. Anticoagulants can stop new blood clots from forming and old ones from growing. They cannot dissolve existing clots. Your body does this by itself over time. Anticoagulants can be given by mouth, by intravenous (IV) access, or by injection. Your caregiver will determine the best program for you.  Heparin or related medicines (low molecular weight heparin) are usually the first treatment for a blood clot. They act quickly. However, they cannot be taken orally.  Heparin can cause a fall in a component of blood that stops bleeding and forms blood clots (platelets). You will be monitored with blood tests to be sure this does not occur.  Warfarin is an anticoagulant that can be swallowed (taken orally). It takes a few days to start working, so usually heparin or related medicines are used in combination. Once warfarin is working, heparin is usually stopped.  Less commonly, clot dissolving drugs (thrombolytics) are used to dissolve a DVT. They carry a high risk of bleeding, so they are used mainly in severe cases,  where a life or limb is threatened.  Very rarely, a blood clot in the leg needs to be removed surgically.  If you are unable to take anticoagulants, your caregiver may arrange for you to have a filter placed in a main vein in your belly (abdomen). This filter prevents clots from traveling to your lungs. HOME CARE INSTRUCTIONS  Take all medicines prescribed by your caregiver. Follow the directions carefully.  Warfarin. Most people will continue taking warfarin after hospital discharge. Your caregiver will advise you on the length of treatment (usually 3 6 months, sometimes lifelong).  Too much and too little warfarin are both dangerous. Too much warfarin increases the risk of bleeding. Too little warfarin continues to allow the risk for blood clots. While taking warfarin, you will need to have regular blood tests to measure your blood clotting time. These blood tests usually include both the prothrombin time (PT) and international normalized ratio (INR) tests. The PT and INR results allow your caregiver to adjust your dose of warfarin. The dose can change for many reasons. It is critically important that   you take warfarin exactly as prescribed, and that you have your PT and INR levels drawn exactly as directed.  Many foods, especially foods high in vitamin K can interfere with warfarin and affect the PT and INR results. Foods high in vitamin K include spinach, kale, broccoli, cabbage, collard and turnip greens, brussels sprouts, peas, cauliflower, seaweed, and parsley as well as beef and pork liver, green tea, and soybean oil. You should eat a consistent amount of foods high in vitamin K. Avoid major changes in your diet, or notify your caregiver before changing your diet. Arrange a visit with a dietitian to answer your questions.  Many medicines can interfere with warfarin and affect the PT and INR results. You must tell your caregiver about any and all medicines you take, this includes all vitamins  and supplements. Be especially cautious with aspirin and anti-inflammatory medicines. Ask your caregiver before taking these. Do not take or discontinue any prescribed or over-the-counter medicine except on the advice of your caregiver or pharmacist.  Warfarin can have side effects, primarily excessive bruising or bleeding. You will need to hold pressure over cuts for longer than usual. Your caregiver or pharmacist will discuss other potential side effects.  Alcohol can change the body's ability to handle warfarin. It is best to avoid alcoholic drinks or consume only very small amounts while taking warfarin. Notify your caregiver if you change your alcohol intake.  Notify your dentist or other caregivers before procedures.  Activity. Ask your caregiver how soon you can go back to normal activities. It is important to stay active to prevent blood clots. If you are on anticoagulant medicine, avoid contact sports.  Exercise. It is very important to exercise. This is especially important while traveling, sitting or standing for long periods of time. Exercise your legs by walking or by pumping the muscles frequently. Take frequent walks.  Compression stockings. These are tight elastic stockings that apply pressure to the lower legs. This pressure can help keep the blood in the legs from clotting. You may need to wear compressions stockings at home to help prevent a DVT.  Smoking. If you smoke, quit. Ask your caregiver for help with quitting smoking.  Learn as much as you can about DVT. Knowing more about the condition should help you keep it from coming back.  Wear a medical alert bracelet or carry a medical alert card. SEEK MEDICAL CARE IF:  You notice a rapid heartbeat.  You feel weaker or more tired than usual.  You feel faint.  You notice increased bruising.  You feel your symptoms are not getting better in the time expected.  You believe you are having side effects of medicine. SEEK  IMMEDIATE MEDICAL CARE IF:  You have chest pain.  You have trouble breathing.  You have new or increased swelling or pain in one leg.  You cough up blood.  You notice blood in vomit, in a bowel movement, or in urine. MAKE SURE YOU:  Understand these instructions.  Will watch your condition.  Will get help right away if you are not doing well or get worse. Document Released: 05/26/2005 Document Revised: 02/18/2012 Document Reviewed: 07/18/2010 [employer] Patient Information 2014 [employer].  

## 2013-01-31 NOTE — Progress Notes (Signed)
  Subjective:    Patient ID: Courtney Simmons, female    DOB: 03-09-1956, 57 y.o.   MRN: [id number]  HPI  Patient was seen on August 15  For recheck of cellulitis- SH ehad originally been seen in ER and was diagnosed with cellulitis and was given Keflex- When I saw her left leg was still swollen and red so we put her on cipro and lasix- selling is better and redness is some better but still has a knot on her leg that concerns her.    Review of Systems  All other systems reviewed and are negative.       Objective:   Physical Exam  Constitutional: She appears well-developed and well-nourished.  Cardiovascular: Normal rate, regular rhythm, normal heart sounds and intact distal pulses.   Pulmonary/Chest: Effort normal and breath sounds normal.  Musculoskeletal:  10cm tendered hard nodule left medial lower ext.    BP 121/80  Pulse 74  Temp(Src) 97.7 F (36.5 C) (Oral)  Ht 5\' 6"  (1.676 m)  Wt 272 lb (123.378 kg)  BMI 43.92 kg/m2       Assessment & Plan:   1. Left leg pain Repeat doppler study Motrin OTC q ^ hrs Continue lasix as rx Elevate leg when sitting - DG Tibia/Fibula Left; Future  Mary-Margaret Daphine Deutscher, FNP

## 2013-01-31 NOTE — Telephone Encounter (Signed)
Redness has decreased but warmth and tenderness remain. She completed the antibiotics today.  Has elevated the leg as much as possible. Patient would like to have the leg reevaluated by Paulene Floor, FNP today. Appt scheduled. Patient aware.

## 2013-02-01 ENCOUNTER — Ambulatory Visit
Admission: RE | Admit: 2013-02-01 | Discharge: 2013-02-01 | Disposition: A | Discharge status: Home / Self Care | Payer: 59 | Source: Ambulatory Visit | Attending: Nurse Practitioner | Admitting: Nurse Practitioner

## 2013-02-01 ENCOUNTER — Other Ambulatory Visit (HOSPITAL_COMMUNITY): Payer: 59

## 2013-02-01 DIAGNOSIS — M79605 Pain in left leg: Secondary | ICD-10-CM

## 2013-05-21 ENCOUNTER — Other Ambulatory Visit: Payer: Self-pay | Admitting: Nurse Practitioner

## 2013-07-24 ENCOUNTER — Other Ambulatory Visit: Payer: Self-pay | Admitting: Nurse Practitioner

## 2013-07-26 NOTE — Telephone Encounter (Signed)
Last seen 06/14 

## 2013-07-26 NOTE — Telephone Encounter (Signed)
ntbs

## 2013-09-27 ENCOUNTER — Other Ambulatory Visit: Payer: Self-pay

## 2013-09-27 MED ORDER — FUROSEMIDE 20 MG PO TABS: 20.0000 mg | ORAL_TABLET | Freq: Every day | ORAL | Status: DC

## 2013-09-27 NOTE — Telephone Encounter (Signed)
Patient NTBS for follow up and lab work  

## 2013-09-27 NOTE — Telephone Encounter (Signed)
Last seen 01/31/13  MMM  

## 2013-10-14 ENCOUNTER — Other Ambulatory Visit: Payer: Self-pay

## 2013-10-14 DIAGNOSIS — Z1231 Encounter for screening mammogram for malignant neoplasm of breast: Secondary | ICD-10-CM

## 2013-10-19 ENCOUNTER — Encounter: Payer: Self-pay | Admitting: Family Medicine

## 2013-10-19 ENCOUNTER — Ambulatory Visit (INDEPENDENT_AMBULATORY_CARE_PROVIDER_SITE_OTHER): Payer: 59 | Admitting: Family Medicine

## 2013-10-19 VITALS — BP 131/74 | HR 64 | Temp 96.9°F | Ht 66.0 in | Wt 274.0 lb

## 2013-10-19 DIAGNOSIS — J069 Acute upper respiratory infection, unspecified: Secondary | ICD-10-CM

## 2013-10-19 MED ORDER — METHYLPREDNISOLONE ACETATE 80 MG/ML IJ SUSP
80.0000 mg | Freq: Once | INTRAMUSCULAR | Status: AC
  Administered 2013-10-19: 80 mg via INTRAMUSCULAR

## 2013-10-19 MED ORDER — AMOXICILLIN 875 MG PO TABS: 875.0000 mg | ORAL_TABLET | Freq: Two times a day (BID) | ORAL | Status: DC

## 2013-10-19 NOTE — Progress Notes (Signed)
   Subjective:    Patient ID: Courtney Simmons, female    DOB: 10/19/1955, 58 y.o.   MRN: [id number]  HPI This 58 y.o. female presents for evaluation of URI sx's for over 2 weeks.   Review of Systems C/o uri sx's No chest pain, SOB, HA, dizziness, vision change, N/V, diarrhea, constipation, dysuria, urinary urgency or frequency, myalgias, arthralgias or rash.     Objective:   Physical Exam Vital signs noted  Well developed well nourished female.  HEENT - Head atraumatic Normocephalic                Eyes - PERRLA, Conjuctiva - clear Sclera- Clear EOMI                Ears - EAC's Wnl TM's Wnl Gross Hearing WNL                Throat - oropharanx wnl Respiratory - Lungs CTA bilateral Cardiac - RRR S1 and S2 without murmur GI - Abdomen soft Nontender and bowel sounds active x 4 Extremities - No edema. Neuro - Grossly intact.       Assessment & Plan:  URI (upper respiratory infection) - Plan: amoxicillin (AMOXIL) 875 MG tablet, methylPREDNISolone acetate (DEPO-MEDROL) injection 80 mg Push po fluids, rest, tylenol and motrin otc prn as directed for fever, arthralgias, and myalgias.  Follow up prn if sx's continue or persist.  Deatra CanterWilliam J Yeison Sippel FNP

## 2013-11-16 ENCOUNTER — Ambulatory Visit
Admission: RE | Admit: 2013-11-16 | Discharge: 2013-11-16 | Disposition: A | Discharge status: Home / Self Care | Payer: 59 | Source: Ambulatory Visit

## 2013-11-16 ENCOUNTER — Encounter (INDEPENDENT_AMBULATORY_CARE_PROVIDER_SITE_OTHER): Payer: Self-pay

## 2013-11-16 DIAGNOSIS — Z1231 Encounter for screening mammogram for malignant neoplasm of breast: Secondary | ICD-10-CM

## 2013-11-25 ENCOUNTER — Ambulatory Visit (INDEPENDENT_AMBULATORY_CARE_PROVIDER_SITE_OTHER): Payer: 59 | Admitting: Family

## 2013-11-25 ENCOUNTER — Encounter: Payer: Self-pay | Admitting: Family

## 2013-11-25 VITALS — BP 119/72 | HR 70 | Temp 97.8°F | Ht 66.0 in | Wt 272.0 lb

## 2013-11-25 DIAGNOSIS — E038 Other specified hypothyroidism: Secondary | ICD-10-CM

## 2013-11-25 DIAGNOSIS — Z0000 Encounter for general adult medical examination without abnormal findings: Secondary | ICD-10-CM

## 2013-11-25 DIAGNOSIS — Z23 Encounter for immunization: Secondary | ICD-10-CM

## 2013-11-25 DIAGNOSIS — E785 Hyperlipidemia, unspecified: Secondary | ICD-10-CM

## 2013-11-25 MED ORDER — LEVOTHYROXINE SODIUM 75 MCG PO TABS: ORAL_TABLET | ORAL | Status: DC

## 2013-11-25 NOTE — Progress Notes (Signed)
Subjective:    Patient ID: Courtney Simmons, female    DOB: 1956/04/11, 58 y.o.   MRN: [id number]  Thyroid Problem Presents for follow-up visit. Patient reports no anxiety, cold intolerance, constipation, diarrhea, fatigue, hair loss or palpitations. The symptoms have been stable. Past treatments include levothyroxine. The treatment provided significant relief.   *Pt was diagnosed with hyperlipidemia last year, and started on Crestor. However, pt states her left leg started swelling and had to go to ED. Pt stopped taking crestor and "swelling went away."    Review of Systems  Constitutional: Negative for fatigue.  Respiratory: Negative.   Cardiovascular: Negative.  Negative for palpitations.  Gastrointestinal: Negative.  Negative for diarrhea and constipation.  Endocrine: Negative for cold intolerance.  Genitourinary: Negative.   Musculoskeletal: Negative.   All other systems reviewed and are negative.      Objective:   Physical Exam  Vitals reviewed. Constitutional: She is oriented to person, place, and time. She appears well-developed and well-nourished. No distress.  HENT:  Head: Normocephalic and atraumatic.  Right Ear: External ear normal.  Nose: Nose normal.  Mouth/Throat: Oropharynx is clear and moist.  Eyes: Pupils are equal, round, and reactive to light.  Neck: Normal range of motion. Neck supple. No thyromegaly present.  Cardiovascular: Normal rate, regular rhythm, normal heart sounds and intact distal pulses.   No murmur heard. Pulmonary/Chest: Effort normal and breath sounds normal. No respiratory distress. She has no wheezes.  Abdominal: Soft. Bowel sounds are normal. She exhibits no distension. There is no tenderness.  Musculoskeletal: Normal range of motion. She exhibits no edema and no tenderness.  Neurological: She is alert and oriented to person, place, and time. She has normal reflexes. No cranial nerve deficit.  Skin: Skin is warm and dry.  Psychiatric:  She has a normal mood and affect. Her behavior is normal. Judgment and thought content normal.    BP 119/72  Pulse 70  Temp(Src) 97.8 F (36.6 C) (Oral)  Ht 5' 6" (1.676 m)  Wt 272 lb (123.378 kg)  BMI 43.92 kg/m2       Assessment & Plan:  1. Need for Tdap vaccination - Tdap vaccine greater than or equal to 7yo IM  2. Other specified hypothyroidism - Thyroid Panel With TSH - levothyroxine (SYNTHROID, LEVOTHROID) 75 MCG tablet; TAKE 1 TABLET (75 MCG TOTAL) BY MOUTH EVERY MORNING.  Dispense: 30 tablet; Refill: 11  3. Annual physical exam - CMP14+EGFR - Vit D  25 hydroxy (rtn osteoporosis monitoring)  4. Hyperlipidemia -Pt was started on Crestor last year, but has since stopped- Pt states she is willing to try other statins if cholesterol levels are high as long as they don't make her leg swell.  - Lipid panel   Continue all meds Labs pending Health Maintenance reviewed Diet and exercise encouraged RTO 1 year   Evelina Dun, FNP

## 2013-11-25 NOTE — Patient Instructions (Addendum)
Tetanus, Diphtheria, Pertussis (Tdap) Vaccine What You Need to Know WHY GET VACCINATED? Tetanus, diphtheria and pertussis can be very serious diseases, even for adolescents and adults. Tdap vaccine can protect [place] from these diseases. TETANUS (Lockjaw) causes painful muscle tightening and stiffness, usually all over the body.  It can lead to tightening of muscles in the head and neck so you can't open your mouth, swallow, or sometimes even breathe. Tetanus kills about 1 out of 5 people who are infected. DIPHTHERIA can cause a thick coating to form in the back of the throat.  It can lead to breathing problems, paralysis, heart failure, and death. PERTUSSIS (Whooping Cough) causes severe coughing spells, which can cause difficulty breathing, vomiting and disturbed sleep.  It can also lead to weight loss, incontinence, and rib fractures. Up to 2 in 100 adolescents and 5 in 100 adults with pertussis are hospitalized or have complications, which could include pneumonia and death. These diseases are caused by bacteria. Diphtheria and pertussis are spread from person to person through coughing or sneezing. Tetanus enters the body through cuts, scratches, or wounds. Before vaccines, the [place] saw as many as 200,000 cases a year of diphtheria and pertussis, and hundreds of cases of tetanus. Since vaccination began, tetanus and diphtheria have dropped by about 99% and pertussis by about 80%. TDAP VACCINE Tdap vaccine can protect adolescents and adults from tetanus, diphtheria, and pertussis. One dose of Tdap is routinely given at age 36 or 66. People who did not get Tdap at that age should get it as soon as possible. Tdap is especially important for health care professionals and anyone having close contact with a baby younger than 12 months. Pregnant women should get a dose of Tdap during every pregnancy, to protect the newborn from pertussis. Infants are most at risk for severe, life-threatening  complications from pertussis. A similar vaccine, called Td, protects from tetanus and diphtheria, but not pertussis. A Td booster should be given every 10 years. Tdap may be given as one of these boosters if you have not already gotten a dose. Tdap may also be given after a severe cut or burn to prevent tetanus infection. Your doctor can give you more information. Tdap may safely be given at the same time as other vaccines. SOME PEOPLE SHOULD NOT GET THIS VACCINE  If you ever had a life-threatening allergic reaction after a dose of any tetanus, diphtheria, or pertussis containing vaccine, OR if you have a severe allergy to any part of this vaccine, you should not get Tdap. Tell your doctor if you have any severe allergies.  If you had a coma, or long or multiple seizures within 7 days after a childhood dose of DTP or DTaP, you should not get Tdap, unless a cause other than the vaccine was found. You can still get Td.  Talk to your doctor if you:  have epilepsy or another nervous system problem,  had severe pain or swelling after any vaccine containing diphtheria, tetanus or pertussis,  ever had Guillain-Barr Syndrome (GBS),  aren't feeling well on the day the shot is scheduled. RISKS OF A VACCINE REACTION With any medicine, including vaccines, there is a chance of side effects. These are usually mild and go away on their own, but serious reactions are also possible. Brief fainting spells can follow a vaccination, leading to injuries from falling. Sitting or lying down for about 15 minutes can help prevent these. Tell your doctor if you feel dizzy or light-headed, or  have vision changes or ringing in the ears. Mild problems following Tdap (Did not interfere with activities)  Pain where the shot was given (about 3 in 4 adolescents or 2 in 3 adults)  Redness or swelling where the shot was given (about 1 person in 5)  Mild fever of at least 100.71F (up to about 1 in 25 adolescents or 1 in  100 adults)  Headache (about 3 or 4 people in 10)  Tiredness (about 1 person in 3 or 4)  Nausea, vomiting, diarrhea, stomach ache (up to 1 in 4 adolescents or 1 in 10 adults)  Chills, body aches, sore joints, rash, swollen glands (uncommon) Moderate problems following Tdap (Interfered with activities, but did not require medical attention)  Pain where the shot was given (about 1 in 5 adolescents or 1 in 100 adults)  Redness or swelling where the shot was given (up to about 1 in 16 adolescents or 1 in 25 adults)  Fever over 102F (about 1 in 100 adolescents or 1 in 250 adults)  Headache (about 3 in 20 adolescents or 1 in 10 adults)  Nausea, vomiting, diarrhea, stomach ache (up to 1 or 3 people in 100)  Swelling of the entire arm where the shot was given (up to about 3 in 100). Severe problems following Tdap (Unable to perform usual activities, required medical attention)  Swelling, severe pain, bleeding and redness in the arm where the shot was given (rare). A severe allergic reaction could occur after any vaccine (estimated less than 1 in a million doses). WHAT IF THERE IS A SERIOUS REACTION? What should I look for?  Look for anything that concerns you, such as signs of a severe allergic reaction, very high fever, or behavior changes. Signs of a severe allergic reaction can include hives, swelling of the face and throat, difficulty breathing, a fast heartbeat, dizziness, and weakness. These would start a few minutes to a few hours after the vaccination. What should I do?  If you think it is a severe allergic reaction or other emergency that can't wait, call 9-1-1 or get the person to the nearest hospital. Otherwise, call your doctor.  Afterward, the reaction should be reported to the "Vaccine Adverse Event Reporting System" (VAERS). Your doctor might file this report, or you can do it yourself through the VAERS web site at www.vaers.SamedayNews.es, or by calling 951-815-7115. VAERS is  only for reporting reactions. They do not give medical advice.  THE NATIONAL VACCINE INJURY COMPENSATION PROGRAM The National Vaccine Injury Compensation Program (VICP) is a federal program that was created to compensate people who may have been injured by certain vaccines. Persons who believe they may have been injured by a vaccine can learn about the program and about filing a claim by calling 805-032-2098 or visiting the [place] website at GoldCloset.com.ee. HOW CAN I LEARN MORE?  Ask your doctor.  Call your local or state health department.  Contact the Centers for Disease Control and Prevention (CDC):  Call 541-084-3575 or visit CDC's website at http://hunter.com/. CDC Tdap Vaccine VIS (10/16/11) Document Released: 11/25/2011 Document Revised: 09/20/2012 Document Reviewed: 09/15/2012 [employer] Patient Information 2015 [place], [place]. This information is not intended to replace advice given to you by your health care provider. Make sure you discuss any questions you have with your health care provider. Health Maintenance, Female A healthy lifestyle and preventative care can promote health and wellness.  Maintain regular health, dental, and eye exams.  Eat a healthy diet. Foods like vegetables, fruits, whole  grains, low-fat dairy products, and lean protein foods contain the nutrients you need without too many calories. Decrease your intake of foods high in solid fats, added sugars, and salt. Get information about a proper diet from your caregiver, if necessary.  Regular physical exercise is one of the most important things you can do for your health. Most adults should get at least 150 minutes of moderate-intensity exercise (any activity that increases your heart rate and causes you to sweat) each week. In addition, most adults need muscle-strengthening exercises on 2 or more days a week.   Maintain a healthy weight. The body mass index (BMI) is a screening tool to  identify possible weight problems. It provides an estimate of body fat based on height and weight. Your caregiver can help determine your BMI, and can help you achieve or maintain a healthy weight. For adults 20 years and older:  A BMI below 18.5 is considered underweight.  A BMI of 18.5 to 24.9 is normal.  A BMI of 25 to 29.9 is considered overweight.  A BMI of 30 and above is considered obese.  Maintain normal blood lipids and cholesterol by exercising and minimizing your intake of saturated fat. Eat a balanced diet with plenty of fruits and vegetables. Blood tests for lipids and cholesterol should begin at age 18 and be repeated every 5 years. If your lipid or cholesterol levels are high, you are over 50, or you are a high risk for heart disease, you may need your cholesterol levels checked more frequently.Ongoing high lipid and cholesterol levels should be treated with medicines if diet and exercise are not effective.  If you smoke, find out from your caregiver how to quit. If you do not use tobacco, do not start.  Lung cancer screening is recommended for adults aged 42-80 years who are at high risk for developing lung cancer because of a history of smoking. Yearly low-dose computed tomography (CT) is recommended for people who have at least a 30-pack-year history of smoking and are a current smoker or have quit within the past 15 years. A pack year of smoking is smoking an average of 1 pack of cigarettes a day for 1 year (for example: 1 pack a day for 30 years or 2 packs a day for 15 years). Yearly screening should continue until the smoker has stopped smoking for at least 15 years. Yearly screening should also be stopped for people who develop a health problem that would prevent them from having lung cancer treatment.  If you are pregnant, do not drink alcohol. If you are breastfeeding, be very cautious about drinking alcohol. If you are not pregnant and choose to drink alcohol, do not exceed  1 drink per day. One drink is considered to be 12 ounces (355 mL) of beer, 5 ounces (148 mL) of wine, or 1.5 ounces (44 mL) of liquor.  Avoid use of street drugs. Do not share needles with anyone. Ask for help if you need support or instructions about stopping the use of drugs.  High blood pressure causes heart disease and increases the risk of stroke. Blood pressure should be checked at least every 1 to 2 years. Ongoing high blood pressure should be treated with medicines, if weight loss and exercise are not effective.  If you are 1 to 57 years old, ask your caregiver if you should take aspirin to prevent strokes.  Diabetes screening involves taking a blood sample to check your fasting blood sugar level. This should be  done once every 3 years, after age 71, if you are within normal weight and without risk factors for diabetes. Testing should be considered at a younger age or be carried out more frequently if you are overweight and have at least 1 risk factor for diabetes.  Breast cancer screening is essential preventative care for women. You should practice "breast self-awareness." This means understanding the normal appearance and feel of your breasts and may include breast self-examination. Any changes detected, no matter how small, should be reported to a caregiver. Women in their 46s and 30s should have a clinical breast exam (CBE) by a caregiver as part of a regular health exam every 1 to 3 years. After age 19, women should have a CBE every year. Starting at age 65, women should consider having a mammogram (breast X-ray) every year. Women who have a family history of breast cancer should talk to their caregiver about genetic screening. Women at a high risk of breast cancer should talk to their caregiver about having an MRI and a mammogram every year.  Breast cancer gene (BRCA)-related cancer risk assessment is recommended for women who have family members with BRCA-related cancers. BRCA-related  cancers include breast, ovarian, tubal, and peritoneal cancers. Having family members with these cancers may be associated with an increased risk for harmful changes (mutations) in the breast cancer genes BRCA1 and BRCA2. Results of the assessment will determine the need for genetic counseling and BRCA1 and BRCA2 testing.  The Pap test is a screening test for cervical cancer. Women should have a Pap test starting at age 40. Between ages 41 and 67, Pap tests should be repeated every 2 years. Beginning at age 24, you should have a Pap test every 3 years as long as the past 3 Pap tests have been normal. If you had a hysterectomy for a problem that was not cancer or a condition that could lead to cancer, then you no longer need Pap tests. If you are between ages 68 and 58, and you have had normal Pap tests going back 10 years, you no longer need Pap tests. If you have had past treatment for cervical cancer or a condition that could lead to cancer, you need Pap tests and screening for cancer for at least 20 years after your treatment. If Pap tests have been discontinued, risk factors (such as a new sexual partner) need to be reassessed to determine if screening should be resumed. Some women have medical problems that increase the chance of getting cervical cancer. In these cases, your caregiver may recommend more frequent screening and Pap tests.  The human papillomavirus (HPV) test is an additional test that may be used for cervical cancer screening. The HPV test looks for the virus that can cause the cell changes on the cervix. The cells collected during the Pap test can be tested for HPV. The HPV test could be used to screen women aged 51 years and older, and should be used in women of any age who have unclear Pap test results. After the age of 66, women should have HPV testing at the same frequency as a Pap test.  Colorectal cancer can be detected and often prevented. Most routine colorectal cancer screening  begins at the age of 73 and continues through age 21. However, your caregiver may recommend screening at an earlier age if you have risk factors for colon cancer. On a yearly basis, your caregiver may provide home test kits to check for hidden blood in  the stool. Use of a small camera at the end of a tube, to directly examine the colon (sigmoidoscopy or colonoscopy), can detect the earliest forms of colorectal cancer. Talk to your caregiver about this at age 18, when routine screening begins. Direct examination of the colon should be repeated every 5 to 10 years through age 109, unless early forms of pre-cancerous polyps or small growths are found.  Hepatitis C blood testing is recommended for all people born from [birth year] through 1964 and any individual with known risks for hepatitis C.  Practice safe sex. Use condoms and avoid high-risk sexual practices to reduce the spread of sexually transmitted infections (STIs). Sexually active women aged 3 and younger should be checked for Chlamydia, which is a common sexually transmitted infection. Older women with new or multiple partners should also be tested for Chlamydia. Testing for other STIs is recommended if you are sexually active and at increased risk.  Osteoporosis is a disease in which the bones lose minerals and strength with aging. This can result in serious bone fractures. The risk of osteoporosis can be identified using a bone density scan. Women ages 32 and over and women at risk for fractures or osteoporosis should discuss screening with their caregivers. Ask your caregiver whether you should be taking a calcium supplement or vitamin D to reduce the rate of osteoporosis.  Menopause can be associated with physical symptoms and risks. Hormone replacement therapy is available to decrease symptoms and risks. You should talk to your caregiver about whether hormone replacement therapy is right for you.  Use sunscreen. Apply sunscreen liberally and  repeatedly throughout the day. You should seek shade when your shadow is shorter than you. Protect yourself by wearing long sleeves, pants, a wide-brimmed hat, and sunglasses year round, whenever you are outdoors.  Notify your caregiver of new moles or changes in moles, especially if there is a change in shape or color. Also notify your caregiver if a mole is larger than the size of a pencil eraser.  Stay current with your immunizations. Document Released: 12/09/2010 Document Revised: 09/20/2012 Document Reviewed: 04/27/2013 [facility] Patient Information 2015 [place], [place]. This information is not intended to replace advice given to you by your health care provider. Make sure you discuss any questions you have with your health care provider.

## 2013-11-26 LAB — CMP14+EGFR
ALT: 23 IU/L (ref 0–32)
AST: 20 IU/L (ref 0–40)
Albumin/Globulin Ratio: 1.4 (ref 1.1–2.5)
Albumin: 4.4 g/dL (ref 3.5–5.5)
Alkaline Phosphatase: 110 IU/L (ref 39–117)
BUN/Creatinine Ratio: 21 (ref 9–23)
BUN: 16 mg/dL (ref 6–24)
CO2: 25 mmol/L (ref 18–29)
Calcium: 9.9 mg/dL (ref 8.7–10.2)
Chloride: 100 mmol/L (ref 97–108)
Creatinine, Ser: 0.76 mg/dL (ref 0.57–1.00)
GFR calc Af Amer: 101 mL/min/{1.73_m2} (ref >59)
GFR calc non Af Amer: 88 mL/min/{1.73_m2} (ref >59)
Globulin, Total: 3.2 g/dL (ref 1.5–4.5)
Glucose: 76 mg/dL (ref 65–99)
Potassium: 4.4 mmol/L (ref 3.5–5.2)
Sodium: 139 mmol/L (ref 134–144)
Total Bilirubin: 0.7 mg/dL (ref 0.0–1.2)
Total Protein: 7.6 g/dL (ref 6.0–8.5)

## 2013-11-26 LAB — LIPID PANEL
Chol/HDL Ratio: 4.3 ratio units (ref 0.0–4.4)
Cholesterol, Total: 228 mg/dL — ABNORMAL HIGH (ref 100–199)
HDL: 53 mg/dL (ref >39)
LDL Calculated: 152 mg/dL — ABNORMAL HIGH (ref 0–99)
Triglycerides: 115 mg/dL (ref 0–149)
VLDL Cholesterol Cal: 23 mg/dL (ref 5–40)

## 2013-11-26 LAB — VITAMIN D 25 HYDROXY (VIT D DEFICIENCY, FRACTURES): Vit D, 25-Hydroxy: 14.9 ng/mL — ABNORMAL LOW (ref 30.0–100.0)

## 2013-11-26 LAB — THYROID PANEL WITH TSH
Free Thyroxine Index: 2.4 (ref 1.2–4.9)
T3 Uptake Ratio: 24 % (ref 24–39)
T4, Total: 10.1 ug/dL (ref 4.5–12.0)
TSH: 3.88 u[IU]/mL (ref 0.450–4.500)

## 2013-11-27 ENCOUNTER — Other Ambulatory Visit: Payer: Self-pay | Admitting: Nurse Practitioner

## 2013-11-28 ENCOUNTER — Other Ambulatory Visit: Payer: Self-pay | Admitting: Family

## 2013-11-28 MED ORDER — SIMVASTATIN 20 MG PO TABS: 20.0000 mg | ORAL_TABLET | Freq: Every day | ORAL | Status: DC

## 2013-11-28 NOTE — Telephone Encounter (Signed)
Message copied by Azalee CourseFULP, ASHLEY on Mon Nov 28, 2013  9:48 AM ------      Message from: Lendon ColonelHAWKS, [place] A      Created: Mon Nov 28, 2013  9:39 AM       Kidney and liver function stable      Cholesterol levels high- RX sent to pharmacy of simvastatin 20 mg-Pt needs to be on low fat diet and exercise      Thyroid levels WNL      Vit D levels low- Need to start on Vit D OTC ------

## 2013-11-29 ENCOUNTER — Telehealth: Payer: Self-pay | Admitting: Family

## 2013-11-29 NOTE — Telephone Encounter (Signed)
PAtient aware of labs °

## 2014-11-21 ENCOUNTER — Other Ambulatory Visit: Payer: Self-pay | Admitting: *Deleted

## 2014-11-21 DIAGNOSIS — E038 Other specified hypothyroidism: Secondary | ICD-10-CM

## 2014-11-21 MED ORDER — LEVOTHYROXINE SODIUM 75 MCG PO TABS: ORAL_TABLET | ORAL | Status: DC

## 2014-11-23 ENCOUNTER — Other Ambulatory Visit: Payer: Self-pay

## 2014-11-23 DIAGNOSIS — E038 Other specified hypothyroidism: Secondary | ICD-10-CM

## 2014-11-23 MED ORDER — LEVOTHYROXINE SODIUM 75 MCG PO TABS: ORAL_TABLET | ORAL | Status: DC

## 2014-11-27 ENCOUNTER — Other Ambulatory Visit: Payer: Self-pay | Admitting: Family

## 2015-06-06 ENCOUNTER — Encounter: Payer: Self-pay | Admitting: Family Medicine

## 2015-06-06 ENCOUNTER — Ambulatory Visit (INDEPENDENT_AMBULATORY_CARE_PROVIDER_SITE_OTHER): Payer: 59 | Admitting: Family Medicine

## 2015-06-06 VITALS — BP 128/77 | HR 72 | Temp 97.2°F | Ht 66.0 in | Wt 275.4 lb

## 2015-06-06 DIAGNOSIS — J0100 Acute maxillary sinusitis, unspecified: Secondary | ICD-10-CM | POA: Diagnosis not present

## 2015-06-06 MED ORDER — AMOXICILLIN-POT CLAVULANATE 875-125 MG PO TABS: 1.0000 | ORAL_TABLET | Freq: Two times a day (BID) | ORAL | Status: DC

## 2015-06-06 MED ORDER — METHYLPREDNISOLONE ACETATE 80 MG/ML IJ SUSP
80.0000 mg | Freq: Once | INTRAMUSCULAR | Status: AC
  Administered 2015-06-06: 80 mg via INTRAMUSCULAR

## 2015-06-06 NOTE — Progress Notes (Signed)
   Subjective:    Patient ID: Courtney Simmons, female    DOB: 07/25/1955, 59 y.o.   MRN: [id number]  HPI 59 year old female with a a month-long history of head congestion. Symptoms are worse at night when she lays down but she cannot sleep. She takes Sudafed during the daytime for some relief. She has not had any cough or significant drainage. Headaches have been banded. She tells me that she was treated here last year for similar symptom and given a shot and an antibiotic that made a miraculous difference in her symptoms.    Review of Systems  Constitutional: Negative.   HENT: Positive for facial swelling.   Respiratory: Negative.   Cardiovascular: Negative.   Neurological: Negative.    Patient Active Problem List   Diagnosis Date Noted  . Trimalleolar fracture of left ankle 01/02/2012  . Hypothyroidism 01/02/2012  . Morbid obesity (HCC) 01/02/2012   Outpatient Encounter Prescriptions as of 06/06/2015  Medication Sig  . ibuprofen (ADVIL,MOTRIN) 200 MG tablet Take 2 tablets (400 mg total) by mouth every 6 (six) hours as needed for pain.  Marland Kitchen. levothyroxine (SYNTHROID, LEVOTHROID) 75 MCG tablet TAKE 1 TABLET (75 MCG TOTAL) BY MOUTH EVERY MORNING.  . [DISCONTINUED] levothyroxine (SYNTHROID, LEVOTHROID) 75 MCG tablet TAKE 1 TABLET (75 MCG TOTAL) BY MOUTH EVERY MORNING.  . [DISCONTINUED] levothyroxine (SYNTHROID, LEVOTHROID) 75 MCG tablet TAKE 1 TABLET (75 MCG TOTAL) BY MOUTH EVERY MORNING.  . [DISCONTINUED] simvastatin (ZOCOR) 20 MG tablet Take 1 tablet (20 mg total) by mouth daily.   No facility-administered encounter medications on file as of 06/06/2015.       Objective:   Physical Exam  Constitutional: She is oriented to person, place, and time. She appears well-developed and well-nourished.  HENT:  Sinuses are tender to percussion in maxillary area  Cardiovascular: Normal rate and regular rhythm.   Pulmonary/Chest: Effort normal and breath sounds normal.  Neurological: She is  alert and oriented to person, place, and time.          Assessment & Plan:  1. Acute maxillary sinusitis, recurrence not specified We'll treat as before with Augmentin 875 mg twice a day and Depo-Medrol 80 mg IM. Continue to use Sudafed 90 pots hot showers and anything to get moisture into the sinuses. Also recommended Flonase once a day  Frederica KusterStephen M Solomiya Pascale MD

## 2015-06-06 NOTE — Addendum Note (Signed)
Addended by: Fawn KirkHOLT, CATHY on: 06/06/2015 03:26 PM   Modules accepted: Orders

## 2015-06-06 NOTE — Addendum Note (Signed)
Addended by: Frederica KusterMILLER, Marcayla Budge M on: 06/06/2015 03:04 PM   Modules accepted: Orders

## 2015-06-06 NOTE — Progress Notes (Signed)
   Subjective:    Patient ID: Courtney Simmons, female    DOB: Jan 01, 1956, 59 y.o.   MRN: [id number]  HPI  Patient Active Problem List   Diagnosis Date Noted  . Trimalleolar fracture of left ankle 01/02/2012  . Hypothyroidism 01/02/2012  . Morbid obesity (HCC) 01/02/2012   Outpatient Encounter Prescriptions as of 06/06/2015  Medication Sig  . ibuprofen (ADVIL,MOTRIN) 200 MG tablet Take 2 tablets (400 mg total) by mouth every 6 (six) hours as needed for pain.  Marland Kitchen. levothyroxine (SYNTHROID, LEVOTHROID) 75 MCG tablet TAKE 1 TABLET (75 MCG TOTAL) BY MOUTH EVERY MORNING.  . [DISCONTINUED] levothyroxine (SYNTHROID, LEVOTHROID) 75 MCG tablet TAKE 1 TABLET (75 MCG TOTAL) BY MOUTH EVERY MORNING.  . [DISCONTINUED] levothyroxine (SYNTHROID, LEVOTHROID) 75 MCG tablet TAKE 1 TABLET (75 MCG TOTAL) BY MOUTH EVERY MORNING.  . [DISCONTINUED] simvastatin (ZOCOR) 20 MG tablet Take 1 tablet (20 mg total) by mouth daily.   No facility-administered encounter medications on file as of 06/06/2015.      Review of Systems     Objective:   Physical Exam        Assessment & Plan:

## 2015-06-10 IMAGING — CR DG TIBIA/FIBULA 2V*L*
2 series · 2 of 2 positions shown · non-contrast
Comparison: 01/02/2012

CLINICAL DATA: Left leg nodule

LEFT TIBIA AND FIBULA - 2 VIEW

[view not recorded (1 of 2)]
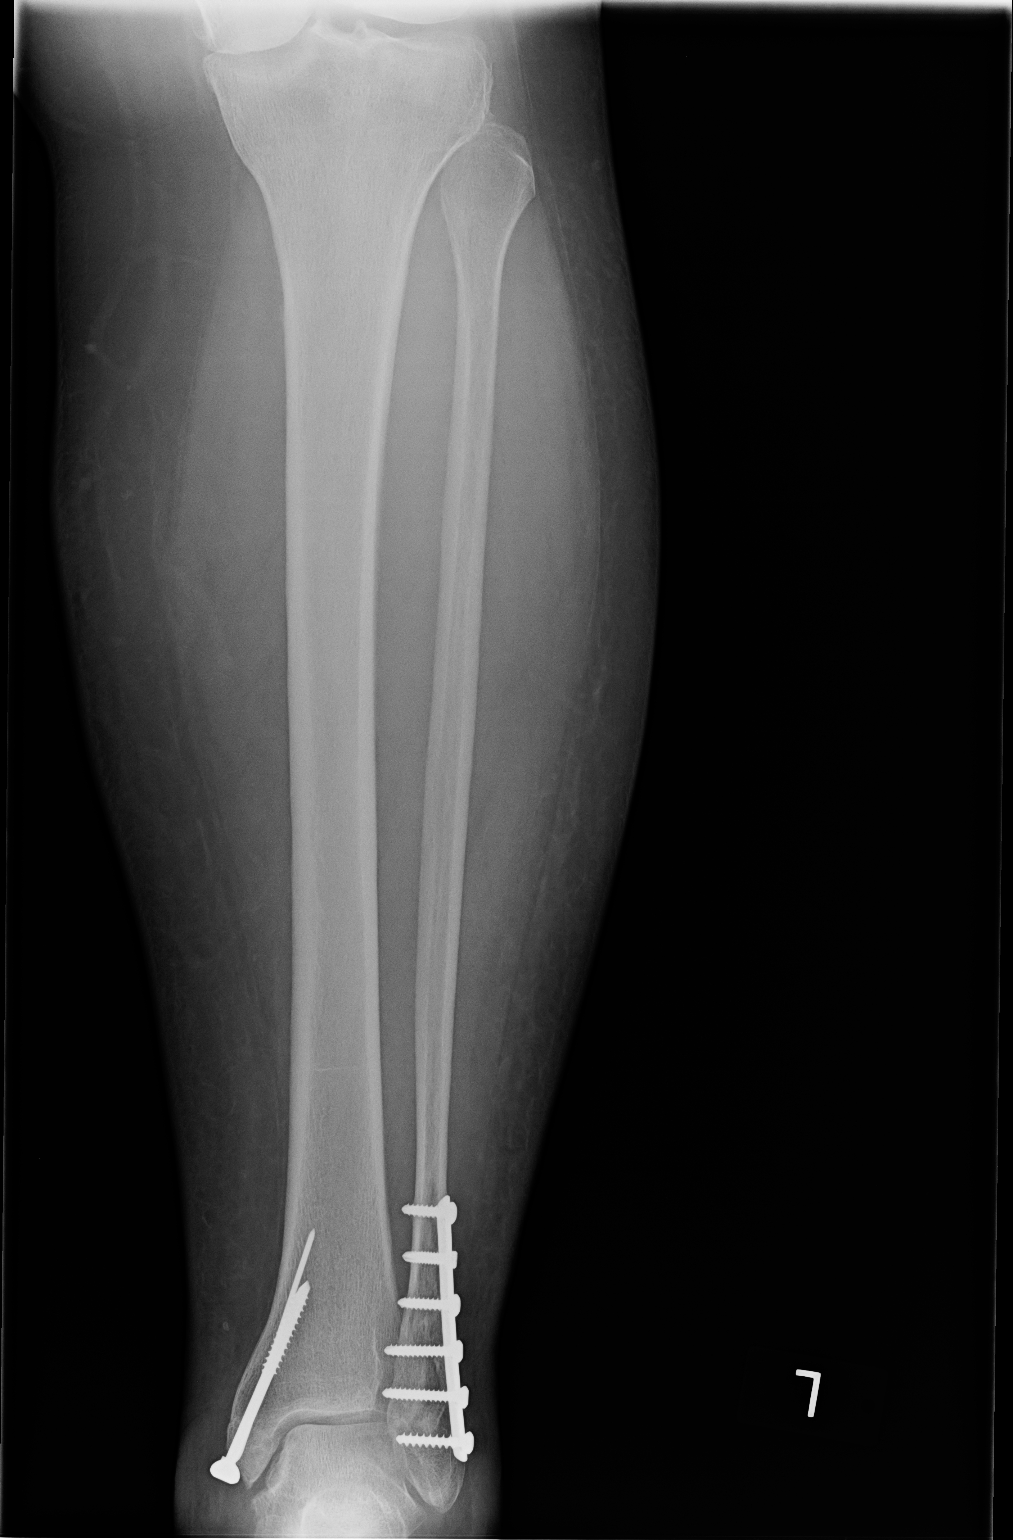

[view not recorded (2 of 2)]
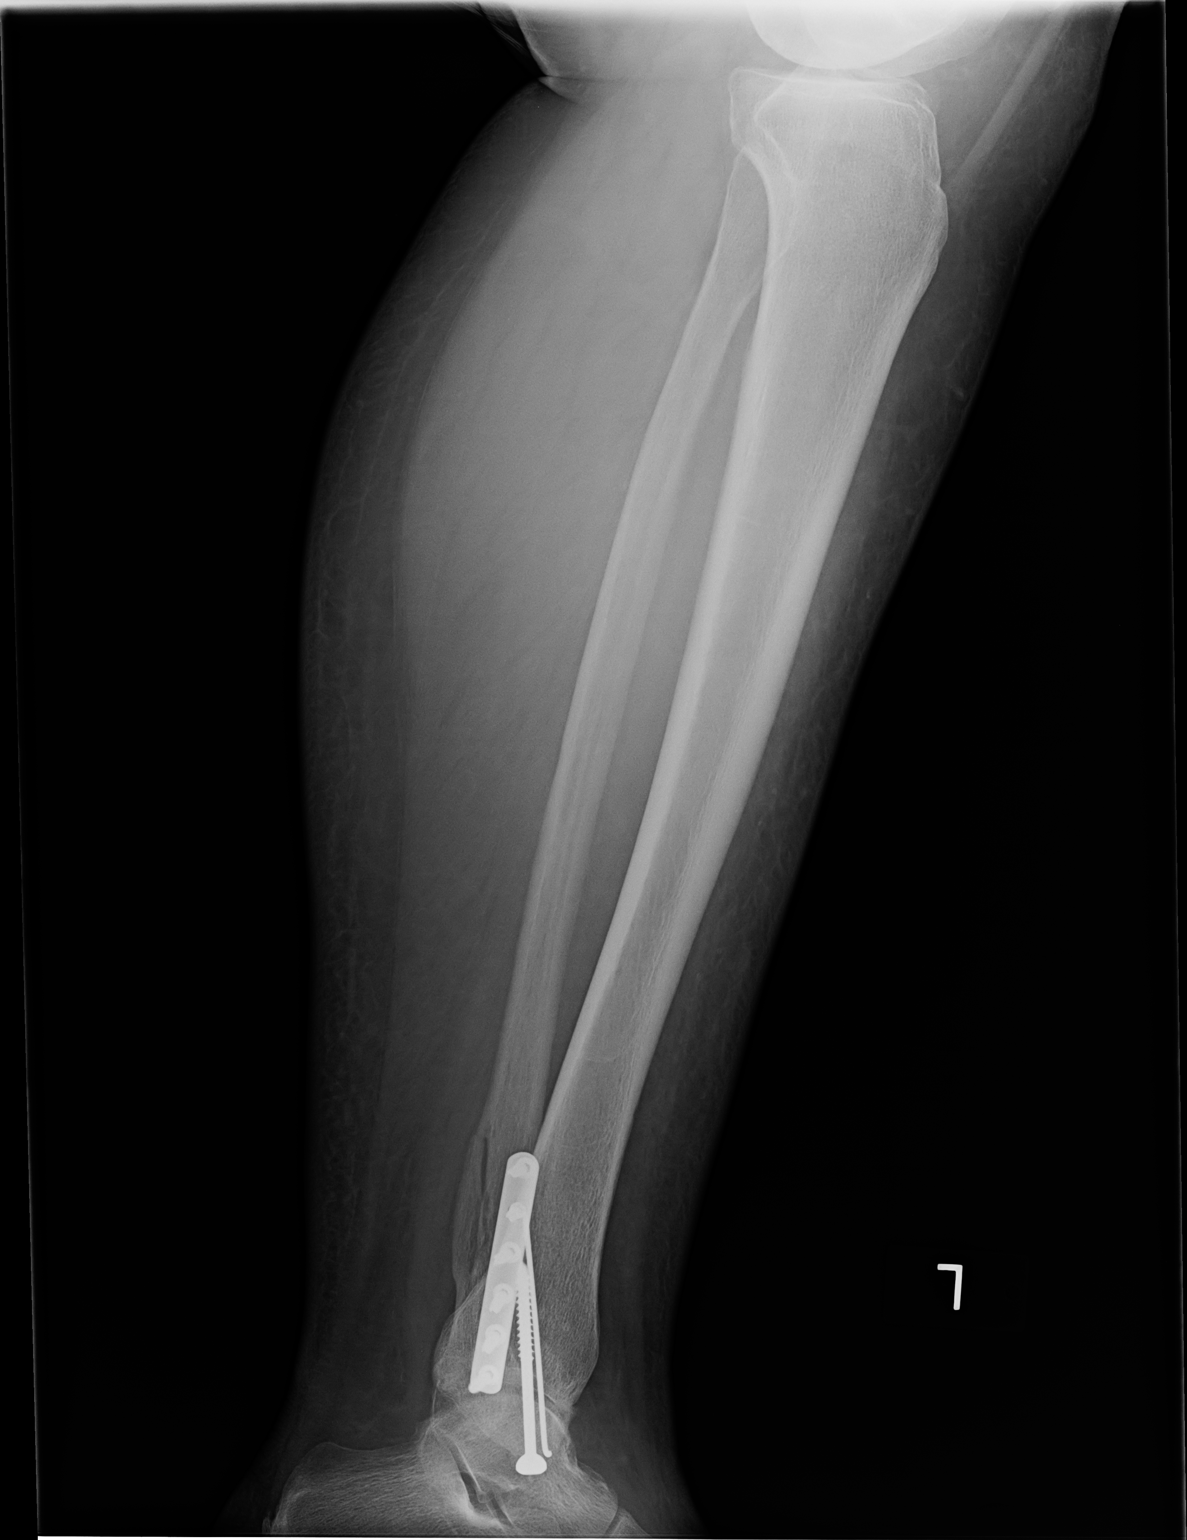

[2 of 2 positions shown; findings below may reference images not displayed]

FINDINGS: Two views of the left tibia-fibula submitted.  No acute
fracture or subluxation. Again noted metallic fixation plate with
screws in the distal fibula.  Stable 2 metallic fixation screws in
the distal tibia medial malleolus.
IMPRESSION: No acute fracture or subluxation.  Stable postsurgical changes
distal tibia and fibula.

Clinically significant discrepancy from primary report, if
provided: None

## 2015-11-26 ENCOUNTER — Other Ambulatory Visit: Payer: Self-pay | Admitting: Nurse Practitioner

## 2015-11-26 DIAGNOSIS — Z1231 Encounter for screening mammogram for malignant neoplasm of breast: Secondary | ICD-10-CM

## 2015-12-02 ENCOUNTER — Other Ambulatory Visit: Payer: Self-pay | Admitting: Family Medicine

## 2015-12-03 NOTE — Telephone Encounter (Signed)
hasnt had thyroid checked in two years nor been seen. Has appt 12/21/15

## 2015-12-12 ENCOUNTER — Ambulatory Visit
Admission: RE | Admit: 2015-12-12 | Discharge: 2015-12-12 | Disposition: A | Discharge status: Home / Self Care | Payer: 59 | Source: Ambulatory Visit | Attending: Nurse Practitioner | Admitting: Nurse Practitioner

## 2015-12-12 DIAGNOSIS — Z1231 Encounter for screening mammogram for malignant neoplasm of breast: Secondary | ICD-10-CM

## 2015-12-21 ENCOUNTER — Encounter: Payer: Self-pay | Admitting: Family

## 2015-12-21 ENCOUNTER — Ambulatory Visit (INDEPENDENT_AMBULATORY_CARE_PROVIDER_SITE_OTHER): Payer: 59 | Admitting: Family

## 2015-12-21 VITALS — BP 143/76 | HR 87 | Temp 97.4°F | Ht 66.0 in | Wt 282.2 lb

## 2015-12-21 DIAGNOSIS — Z01419 Encounter for gynecological examination (general) (routine) without abnormal findings: Secondary | ICD-10-CM

## 2015-12-21 DIAGNOSIS — E038 Other specified hypothyroidism: Secondary | ICD-10-CM

## 2015-12-21 DIAGNOSIS — Z0000 Encounter for general adult medical examination without abnormal findings: Secondary | ICD-10-CM

## 2015-12-21 DIAGNOSIS — M5412 Radiculopathy, cervical region: Secondary | ICD-10-CM

## 2015-12-21 MED ORDER — LEVOTHYROXINE SODIUM 75 MCG PO TABS: 75.0000 ug | ORAL_TABLET | Freq: Every morning | ORAL | Status: DC

## 2015-12-21 MED ORDER — NAPROXEN 500 MG PO TABS: 500.0000 mg | ORAL_TABLET | Freq: Two times a day (BID) | ORAL | Status: DC

## 2015-12-21 MED ORDER — PREDNISONE 10 MG (21) PO TBPK: 10.0000 mg | ORAL_TABLET | Freq: Every day | ORAL | Status: DC

## 2015-12-21 NOTE — Progress Notes (Signed)
Subjective:    Patient ID: Courtney Simmons, female    DOB: [birth year], 60 y.o.   MRN: [id number]  Pt presents to the office today for CPE with pap.  Gynecologic Exam The patient's pertinent negatives include no genital itching, pelvic pain or vaginal discharge. Pertinent negatives include no diarrhea or headaches.  Shoulder Pain  The pain is present in the left shoulder. This is a new problem. The current episode started 1 to 4 weeks ago. There has been no history of extremity trauma. The problem has been waxing and waning. The quality of the pain is described as aching. The pain is at a severity of 8/10. The pain is moderate. Associated symptoms include numbness and tingling. Pertinent negatives include no limited range of motion. The symptoms are aggravated by activity. She has tried acetaminophen, rest and NSAIDS for the symptoms. The treatment provided mild relief.  Thyroid Problem Presents for follow-up visit. Patient reports no anxiety, depressed mood, diaphoresis, diarrhea, fatigue, palpitations or weight gain. The symptoms have been stable. Past treatments include levothyroxine. The treatment provided significant relief. Her past medical history is significant for hyperlipidemia.  Hyperlipidemia This is a chronic problem. The current episode started more than 1 year ago. The problem is uncontrolled. Recent lipid tests were reviewed and are high. Exacerbating diseases include obesity. Pertinent negatives include no shortness of breath. Current antihyperlipidemic treatment includes diet change. The current treatment provides no improvement of lipids. Risk factors for coronary artery disease include dyslipidemia, obesity, a sedentary lifestyle, post-menopausal and family history.      Review of Systems  Constitutional: Negative.  Negative for weight gain, diaphoresis and fatigue.  HENT: Negative.   Eyes: Negative.   Respiratory: Negative.  Negative for shortness of breath.     Cardiovascular: Negative.  Negative for palpitations.  Gastrointestinal: Negative.  Negative for diarrhea.  Endocrine: Negative.   Genitourinary: Negative for vaginal discharge and pelvic pain.  Musculoskeletal: Negative.   Neurological: Positive for tingling and numbness. Negative for headaches.  Hematological: Negative.   Psychiatric/Behavioral: Negative.  The patient is not nervous/anxious.   All other systems reviewed and are negative.      Objective:   Physical Exam  Constitutional: She is oriented to person, place, and time. She appears well-developed and well-nourished. No distress.  Morbid Obesity  HENT:  Head: Normocephalic and atraumatic.  Right Ear: External ear normal.  Left Ear: External ear normal.  Nose: Nose normal.  Mouth/Throat: Oropharynx is clear and moist.  Eyes: Pupils are equal, round, and reactive to light.  Neck: Normal range of motion. Neck supple. No thyromegaly present.  Cardiovascular: Normal rate, regular rhythm, normal heart sounds and intact distal pulses.   No murmur heard. Pulmonary/Chest: Effort normal and breath sounds normal. No respiratory distress. She has no wheezes. Right breast exhibits no inverted nipple, no mass, no nipple discharge, no skin change and no tenderness. Left breast exhibits no inverted nipple, no mass, no nipple discharge, no skin change and no tenderness. Breasts are symmetrical.  Abdominal: Soft. Bowel sounds are normal. She exhibits no distension. There is no tenderness.  Genitourinary: Vagina normal.  Bimanual exam- no adnexal masses or tenderness, ovaries nonpalpable   Cervix parous and pink- No discharge   Musculoskeletal: Normal range of motion. She exhibits no edema or tenderness.  Neurological: She is alert and oriented to person, place, and time. She has normal reflexes. No cranial nerve deficit.  Skin: Skin is warm and dry.  Psychiatric: She has a normal mood  and affect. Her behavior is normal. Judgment and  thought content normal.  Vitals reviewed.   BP 143/76 mmHg  Pulse 87  Temp(Src) 97.4 F (36.3 C) (Oral)  Ht _0  (1.676 m)  Wt 282 lb 3.2 oz (128.005 kg)  BMI 45.57 kg/m2       Assessment & Plan:  1. Other specified hypothyroidism - CMP14+EGFR - Thyroid Panel With TSH  2. Morbid obesity, unspecified obesity type ([place]) - CMP14+EGFR  3. Annual physical exam - CMP14+EGFR - Anemia Profile B - Lipid panel - Thyroid Panel With TSH - VITAMIN D 25 Hydroxy (Vit-D Deficiency, Fractures) - Hepatitis C Antibody  4. Encounter for routine gynecological examination - CMP14+EGFR - Pap IG w/ reflex to HPV when ASC-U  5. Cervical radiculitis - CMP14+EGFR - predniSONE (STERAPRED UNI-PAK 21 TAB) 10 MG (21) TBPK tablet; Take 1 tablet (10 mg total) by mouth daily. As directed x 6 days  Dispense: 21 tablet; Refill: 0 - naproxen (NAPROSYN) 500 MG tablet; Take 1 tablet (500 mg total) by mouth 2 (two) times daily with a meal.  Dispense: 60 tablet; Refill: 1   Continue all meds Labs pending Health Maintenance reviewed Diet and exercise encouraged RTO 1 year  Evelina Dun, FNP

## 2015-12-21 NOTE — Patient Instructions (Signed)

## 2015-12-22 LAB — ANEMIA PROFILE B
Basophils Absolute: 0 10*3/uL (ref 0.0–0.2)
Basos: 0 %
EOS (ABSOLUTE): 0.2 10*3/uL (ref 0.0–0.4)
Eos: 2 %
Ferritin: 114 ng/mL (ref 15–150)
Folate: 9.2 ng/mL (ref >3.0)
Hematocrit: 40.2 % (ref 34.0–46.6)
Hemoglobin: 13.4 g/dL (ref 11.1–15.9)
Immature Grans (Abs): 0 10*3/uL (ref 0.0–0.1)
Immature Granulocytes: 0 %
Iron Saturation: 27 % (ref 15–55)
Iron: 105 ug/dL (ref 27–159)
Lymphocytes Absolute: 2.6 10*3/uL (ref 0.7–3.1)
Lymphs: 26 %
MCH: 29.5 pg (ref 26.6–33.0)
MCHC: 33.3 g/dL (ref 31.5–35.7)
MCV: 89 fL (ref 79–97)
Monocytes Absolute: 0.4 10*3/uL (ref 0.1–0.9)
Monocytes: 4 %
Neutrophils Absolute: 6.5 10*3/uL (ref 1.4–7.0)
Neutrophils: 68 %
Platelets: 324 10*3/uL (ref 150–379)
RBC: 4.54 x10E6/uL (ref 3.77–5.28)
RDW: 13.5 % (ref 12.3–15.4)
Retic Ct Pct: 1.7 % (ref 0.6–2.6)
Total Iron Binding Capacity: 388 ug/dL (ref 250–450)
UIBC: 283 ug/dL (ref 131–425)
Vitamin B-12: 384 pg/mL (ref 211–946)
WBC: 9.8 10*3/uL (ref 3.4–10.8)

## 2015-12-22 LAB — CMP14+EGFR
ALT: 28 IU/L (ref 0–32)
AST: 26 IU/L (ref 0–40)
Albumin/Globulin Ratio: 1.5 (ref 1.2–2.2)
Albumin: 4.5 g/dL (ref 3.5–5.5)
Alkaline Phosphatase: 110 IU/L (ref 39–117)
BUN/Creatinine Ratio: 18 (ref 9–23)
BUN: 13 mg/dL (ref 6–24)
Bilirubin Total: 0.6 mg/dL (ref 0.0–1.2)
CO2: 25 mmol/L (ref 18–29)
Calcium: 9.9 mg/dL (ref 8.7–10.2)
Chloride: 101 mmol/L (ref 96–106)
Creatinine, Ser: 0.74 mg/dL (ref 0.57–1.00)
GFR calc Af Amer: 103 mL/min/{1.73_m2} (ref >59)
GFR calc non Af Amer: 90 mL/min/{1.73_m2} (ref >59)
Globulin, Total: 3.1 g/dL (ref 1.5–4.5)
Glucose: 88 mg/dL (ref 65–99)
Potassium: 4.2 mmol/L (ref 3.5–5.2)
Sodium: 145 mmol/L — ABNORMAL HIGH (ref 134–144)
Total Protein: 7.6 g/dL (ref 6.0–8.5)

## 2015-12-22 LAB — HEPATITIS C ANTIBODY: Hep C Virus Ab: <0.1 s/co ratio (ref 0.0–0.9)

## 2015-12-22 LAB — LIPID PANEL
Chol/HDL Ratio: 4.4 ratio units (ref 0.0–4.4)
Cholesterol, Total: 191 mg/dL (ref 100–199)
HDL: 43 mg/dL (ref >39)
LDL Calculated: 120 mg/dL — ABNORMAL HIGH (ref 0–99)
Triglycerides: 140 mg/dL (ref 0–149)
VLDL Cholesterol Cal: 28 mg/dL (ref 5–40)

## 2015-12-22 LAB — THYROID PANEL WITH TSH
Free Thyroxine Index: 2.1 (ref 1.2–4.9)
T3 Uptake Ratio: 24 % (ref 24–39)
T4, Total: 8.8 ug/dL (ref 4.5–12.0)
TSH: 4.28 u[IU]/mL (ref 0.450–4.500)

## 2015-12-22 LAB — VITAMIN D 25 HYDROXY (VIT D DEFICIENCY, FRACTURES): Vit D, 25-Hydroxy: 21.2 ng/mL — ABNORMAL LOW (ref 30.0–100.0)

## 2015-12-24 LAB — PAP IG W/ RFLX HPV ASCU: PAP Smear Comment: 0

## 2015-12-25 ENCOUNTER — Other Ambulatory Visit: Payer: Self-pay | Admitting: Family

## 2015-12-25 DIAGNOSIS — E559 Vitamin D deficiency, unspecified: Secondary | ICD-10-CM | POA: Insufficient documentation

## 2015-12-25 DIAGNOSIS — E785 Hyperlipidemia, unspecified: Secondary | ICD-10-CM | POA: Insufficient documentation

## 2015-12-25 MED ORDER — SIMVASTATIN 20 MG PO TABS: 20.0000 mg | ORAL_TABLET | Freq: Every day | ORAL | Status: DC

## 2015-12-25 MED ORDER — VITAMIN D (ERGOCALCIFEROL) 1.25 MG (50000 UNIT) PO CAPS: 50000.0000 [IU] | ORAL_CAPSULE | ORAL | Status: DC

## 2016-03-14 ENCOUNTER — Other Ambulatory Visit: Payer: Self-pay | Admitting: Family

## 2016-03-14 DIAGNOSIS — M5412 Radiculopathy, cervical region: Secondary | ICD-10-CM

## 2016-05-08 ENCOUNTER — Other Ambulatory Visit: Payer: Self-pay | Admitting: Family

## 2016-07-09 ENCOUNTER — Other Ambulatory Visit: Payer: Self-pay

## 2016-07-09 MED ORDER — LEVOTHYROXINE SODIUM 75 MCG PO TABS: 75.0000 ug | ORAL_TABLET | Freq: Every morning | ORAL | 1 refills | Status: DC

## 2016-07-10 MED ORDER — SIMVASTATIN 20 MG PO TABS: 20.0000 mg | ORAL_TABLET | Freq: Every day | ORAL | 0 refills | Status: DC

## 2016-07-17 ENCOUNTER — Telehealth: Payer: Self-pay | Admitting: *Deleted

## 2016-07-17 NOTE — Telephone Encounter (Signed)
[facility] is requesting refill on Vitamin D 50,000.  Last lab was 12-2015 at level of  21. Refill ?

## 2016-07-18 NOTE — Telephone Encounter (Signed)
Please take vitamin D 3 ,  2000 units daily.  Provider will recheck vitamin D levels at your next annual check up.

## 2016-10-08 ENCOUNTER — Other Ambulatory Visit: Payer: Self-pay | Admitting: Family

## 2016-11-11 ENCOUNTER — Other Ambulatory Visit: Payer: Self-pay | Admitting: Nurse Practitioner

## 2016-11-11 DIAGNOSIS — Z1231 Encounter for screening mammogram for malignant neoplasm of breast: Secondary | ICD-10-CM

## 2016-12-04 ENCOUNTER — Other Ambulatory Visit: Payer: Self-pay | Admitting: Family

## 2016-12-04 NOTE — Telephone Encounter (Signed)
Last Vit D 12/21/15  21.2

## 2016-12-12 ENCOUNTER — Ambulatory Visit: Payer: 59

## 2016-12-19 ENCOUNTER — Ambulatory Visit
Admission: RE | Admit: 2016-12-19 | Discharge: 2016-12-19 | Disposition: A | Discharge status: Home / Self Care | Payer: 59 | Source: Ambulatory Visit | Attending: Nurse Practitioner | Admitting: Nurse Practitioner

## 2016-12-19 DIAGNOSIS — Z1231 Encounter for screening mammogram for malignant neoplasm of breast: Secondary | ICD-10-CM

## 2017-01-09 ENCOUNTER — Other Ambulatory Visit: Payer: Self-pay | Admitting: Family Medicine

## 2017-01-23 ENCOUNTER — Ambulatory Visit (INDEPENDENT_AMBULATORY_CARE_PROVIDER_SITE_OTHER): Payer: 59 | Admitting: Family

## 2017-01-23 ENCOUNTER — Encounter: Payer: Self-pay | Admitting: Family

## 2017-01-23 VITALS — BP 113/73 | HR 77 | Temp 97.7°F | Ht 66.0 in | Wt 284.0 lb

## 2017-01-23 DIAGNOSIS — E785 Hyperlipidemia, unspecified: Secondary | ICD-10-CM

## 2017-01-23 DIAGNOSIS — Z0000 Encounter for general adult medical examination without abnormal findings: Secondary | ICD-10-CM

## 2017-01-23 DIAGNOSIS — E559 Vitamin D deficiency, unspecified: Secondary | ICD-10-CM

## 2017-01-23 DIAGNOSIS — E039 Hypothyroidism, unspecified: Secondary | ICD-10-CM

## 2017-01-23 NOTE — Patient Instructions (Signed)

## 2017-01-23 NOTE — Progress Notes (Signed)
   Subjective:    Patient ID: Courtney Simmons, female    DOB: 08-14-1955, 61 y.o.   MRN: [id number]  Pt presents to the office today for CPE without pap.  Thyroid Problem  Presents for follow-up visit. Patient reports no anxiety, diarrhea or hoarse voice. The symptoms have been stable. Her past medical history is significant for hyperlipidemia.  Hyperlipidemia  This is a chronic problem. The current episode started more than 1 year ago. The problem is uncontrolled. Recent lipid tests were reviewed and are high. Exacerbating diseases include obesity. Current antihyperlipidemic treatment includes statins (every other day). The current treatment provides mild improvement of lipids. Risk factors for coronary artery disease include diabetes mellitus, dyslipidemia and a sedentary lifestyle.      Review of Systems  HENT: Negative for hoarse voice.   Gastrointestinal: Negative for diarrhea.  Psychiatric/Behavioral: The patient is not nervous/anxious.   All other systems reviewed and are negative.      Objective:   Physical Exam  Constitutional: She is oriented to person, place, and time. She appears well-developed and well-nourished. No distress.  Morbid obese   HENT:  Head: Normocephalic and atraumatic.  Right Ear: External ear normal.  Left Ear: External ear normal.  Nose: Nose normal.  Mouth/Throat: Oropharynx is clear and moist.  Eyes: Pupils are equal, round, and reactive to light.  Neck: Normal range of motion. Neck supple. No thyromegaly present.  Cardiovascular: Normal rate, regular rhythm, normal heart sounds and intact distal pulses.   No murmur heard. Pulmonary/Chest: Effort normal and breath sounds normal. No respiratory distress. She has no wheezes.  Abdominal: Soft. Bowel sounds are normal. She exhibits no distension. There is no tenderness.  Musculoskeletal: Normal range of motion. She exhibits no edema or tenderness.  Neurological: She is alert and oriented to person,  place, and time. She has normal reflexes. No cranial nerve deficit.  Skin: Skin is warm and dry.  Psychiatric: She has a normal mood and affect. Her behavior is normal. Judgment and thought content normal.  Vitals reviewed.    BP 113/73   Pulse 77   Temp 97.7 F (36.5 C) (Oral)   Ht _0  (1.676 m)   Wt 284 lb (128.8 kg) Comment: waist 52 inches  BMI 45.84 kg/m      Assessment & Plan:  1. Annual physical exam - CMP14+EGFR - CBC with Differential/Platelet - Lipid panel - TSH - VITAMIN D 25 Hydroxy (Vit-D Deficiency, Fractures)  2. Hypothyroidism, unspecified type - CMP14+EGFR - CBC with Differential/Platelet - TSH  3. Hyperlipidemia, unspecified hyperlipidemia type - CMP14+EGFR - CBC with Differential/Platelet - Lipid panel  4. Morbid obesity ([place]) - CMP14+EGFR - CBC with Differential/Platelet  5. Vitamin D deficiency  - CMP14+EGFR - CBC with Differential/Platelet   Continue all meds Labs pending Health Maintenance reviewed Diet and exercise encouraged RTO 1 year   Evelina Dun, FNP

## 2017-01-24 LAB — CBC WITH DIFFERENTIAL/PLATELET
Basophils Absolute: 0 x10E3/uL (ref 0.0–0.2)
Basos: 0 %
EOS (ABSOLUTE): 0.2 x10E3/uL (ref 0.0–0.4)
Eos: 2 %
Hematocrit: 41.2 % (ref 34.0–46.6)
Hemoglobin: 13.5 g/dL (ref 11.1–15.9)
Immature Grans (Abs): 0 x10E3/uL (ref 0.0–0.1)
Immature Granulocytes: 0 %
Lymphocytes Absolute: 3.2 x10E3/uL — ABNORMAL HIGH (ref 0.7–3.1)
Lymphs: 33 %
MCH: 29.3 pg (ref 26.6–33.0)
MCHC: 32.8 g/dL (ref 31.5–35.7)
MCV: 90 fL (ref 79–97)
Monocytes Absolute: 0.4 x10E3/uL (ref 0.1–0.9)
Monocytes: 4 %
Neutrophils Absolute: 5.9 x10E3/uL (ref 1.4–7.0)
Neutrophils: 61 %
Platelets: 368 x10E3/uL (ref 150–379)
RBC: 4.6 x10E6/uL (ref 3.77–5.28)
RDW: 13.8 % (ref 12.3–15.4)
WBC: 9.7 x10E3/uL (ref 3.4–10.8)

## 2017-01-24 LAB — TSH: TSH: 3.91 u[IU]/mL (ref 0.450–4.500)

## 2017-01-24 LAB — LIPID PANEL
Chol/HDL Ratio: 3.4 ratio (ref 0.0–4.4)
Cholesterol, Total: 155 mg/dL (ref 100–199)
HDL: 46 mg/dL (ref >39)
LDL Calculated: 85 mg/dL (ref 0–99)
Triglycerides: 118 mg/dL (ref 0–149)
VLDL Cholesterol Cal: 24 mg/dL (ref 5–40)

## 2017-01-24 LAB — CMP14+EGFR
ALT: 21 IU/L (ref 0–32)
AST: 22 IU/L (ref 0–40)
Albumin/Globulin Ratio: 1.4 (ref 1.2–2.2)
Albumin: 4.5 g/dL (ref 3.5–5.5)
Alkaline Phosphatase: 118 IU/L — ABNORMAL HIGH (ref 39–117)
BUN/Creatinine Ratio: 23 (ref 9–23)
BUN: 19 mg/dL (ref 6–24)
Bilirubin Total: 0.4 mg/dL (ref 0.0–1.2)
CO2: 22 mmol/L (ref 20–29)
Calcium: 9.9 mg/dL (ref 8.7–10.2)
Chloride: 103 mmol/L (ref 96–106)
Creatinine, Ser: 0.83 mg/dL (ref 0.57–1.00)
GFR calc Af Amer: 89 mL/min/{1.73_m2} (ref >59)
GFR calc non Af Amer: 77 mL/min/{1.73_m2} (ref >59)
Globulin, Total: 3.2 g/dL (ref 1.5–4.5)
Glucose: 80 mg/dL (ref 65–99)
Potassium: 4.4 mmol/L (ref 3.5–5.2)
Sodium: 142 mmol/L (ref 134–144)
Total Protein: 7.7 g/dL (ref 6.0–8.5)

## 2017-01-24 LAB — VITAMIN D 25 HYDROXY (VIT D DEFICIENCY, FRACTURES): Vit D, 25-Hydroxy: 38.2 ng/mL (ref 30.0–100.0)

## 2017-01-27 ENCOUNTER — Other Ambulatory Visit: Payer: Self-pay | Admitting: Family

## 2017-02-09 ENCOUNTER — Other Ambulatory Visit: Payer: Self-pay | Admitting: Family Medicine

## 2017-03-04 ENCOUNTER — Other Ambulatory Visit: Payer: Self-pay | Admitting: Family

## 2017-03-06 ENCOUNTER — Other Ambulatory Visit: Payer: Self-pay | Admitting: Family

## 2017-03-06 NOTE — Telephone Encounter (Signed)
Last Vit D 01/23/17  38.2 

## 2017-05-27 ENCOUNTER — Other Ambulatory Visit: Payer: Self-pay | Admitting: Family

## 2017-05-27 NOTE — Telephone Encounter (Signed)
Last Vit D 01/23/17  38.2

## 2017-08-17 ENCOUNTER — Other Ambulatory Visit: Payer: Self-pay | Admitting: *Deleted

## 2017-08-17 MED ORDER — VITAMIN D (ERGOCALCIFEROL) 1.25 MG (50000 UNIT) PO CAPS: 50000.0000 [IU] | ORAL_CAPSULE | ORAL | 0 refills | Status: DC

## 2017-12-03 ENCOUNTER — Other Ambulatory Visit: Payer: Self-pay | Admitting: Nurse Practitioner

## 2017-12-03 DIAGNOSIS — Z1231 Encounter for screening mammogram for malignant neoplasm of breast: Secondary | ICD-10-CM

## 2017-12-23 ENCOUNTER — Ambulatory Visit
Admission: RE | Admit: 2017-12-23 | Discharge: 2017-12-23 | Disposition: A | Discharge status: Home / Self Care | Payer: 59 | Source: Ambulatory Visit | Attending: Nurse Practitioner | Admitting: Nurse Practitioner

## 2017-12-23 DIAGNOSIS — Z1231 Encounter for screening mammogram for malignant neoplasm of breast: Secondary | ICD-10-CM

## 2018-03-02 ENCOUNTER — Other Ambulatory Visit: Payer: Self-pay | Admitting: Family

## 2018-03-26 ENCOUNTER — Encounter: Payer: Self-pay | Admitting: Family

## 2018-03-26 ENCOUNTER — Ambulatory Visit (INDEPENDENT_AMBULATORY_CARE_PROVIDER_SITE_OTHER): Payer: 59 | Admitting: Family

## 2018-03-26 VITALS — BP 137/83 | HR 70 | Temp 97.3°F | Ht 66.0 in | Wt 285.2 lb

## 2018-03-26 DIAGNOSIS — E785 Hyperlipidemia, unspecified: Secondary | ICD-10-CM

## 2018-03-26 DIAGNOSIS — Z01419 Encounter for gynecological examination (general) (routine) without abnormal findings: Secondary | ICD-10-CM | POA: Diagnosis not present

## 2018-03-26 DIAGNOSIS — E559 Vitamin D deficiency, unspecified: Secondary | ICD-10-CM | POA: Diagnosis not present

## 2018-03-26 DIAGNOSIS — E039 Hypothyroidism, unspecified: Secondary | ICD-10-CM | POA: Diagnosis not present

## 2018-03-26 DIAGNOSIS — Z0000 Encounter for general adult medical examination without abnormal findings: Secondary | ICD-10-CM

## 2018-03-26 MED ORDER — LEVOTHYROXINE SODIUM 75 MCG PO TABS: 75.0000 ug | ORAL_TABLET | Freq: Every morning | ORAL | 4 refills | Status: DC

## 2018-03-26 NOTE — Patient Instructions (Signed)

## 2018-03-26 NOTE — Progress Notes (Signed)
Subjective:    Patient ID: Courtney Simmons, female    DOB: 1956/01/07, 62 y.o.   MRN: [id number]  Chief Complaint  Patient presents with  . Annual Exam   PT presents to the office today for CPE with pap.  Gynecologic Exam  The patient's primary symptoms include vaginal bleeding (states she will have spotting 2-3 times a year when she wipes). The patient's pertinent negatives include no genital itching, genital lesions or vaginal discharge. Pertinent negatives include no painful intercourse. The treatment provided no relief.  Thyroid Problem  Presents for follow-up visit. Patient reports no anxiety, depressed mood, diaphoresis, fatigue or hoarse voice. The symptoms have been stable. Her past medical history is significant for hyperlipidemia.  Hyperlipidemia  This is a chronic problem. The current episode started more than 1 year ago. The problem is controlled. Recent lipid tests were reviewed and are normal. Exacerbating diseases include obesity. She has no history of chronic renal disease. She is currently on no antihyperlipidemic treatment. The current treatment provides no improvement of lipids. Risk factors for coronary artery disease include dyslipidemia, a sedentary lifestyle and post-menopausal.      Review of Systems  Constitutional: Negative for diaphoresis and fatigue.  HENT: Negative for hoarse voice.   Genitourinary: Negative for vaginal discharge.  Psychiatric/Behavioral: The patient is not nervous/anxious.   All other systems reviewed and are negative.  Family History  Problem Relation Age of Onset  . Heart attack Mother 10       died 44  . Hypertension Mother   . Diabetes Mother   . Gout Father   . Coronary artery disease Father   . Diabetes Sister   . Stroke Sister   . Coronary artery disease Sister        died age 66  . Obesity Sister   . Cancer Brother        died age 35  . Stroke Brother 43  . Colon cancer Neg Hx   . Esophageal cancer Neg Hx   .  Rectal cancer Neg Hx   . Stomach cancer Neg Hx     Social History   Socioeconomic History  . Marital status: Married    Spouse name: Not on file  . Number of children: Not on file  . Years of education: Not on file  . Highest education level: Not on file  Occupational History  . Occupation: Theme park manager: [employer]  Social Needs  . Financial resource strain: Not on file  . Food insecurity:    Worry: Not on file    Inability: Not on file  . Transportation needs:    Medical: Not on file    Non-medical: Not on file  Tobacco Use  . Smoking status: Never Smoker  . Smokeless tobacco: Never Used  Substance and Sexual Activity  . Alcohol use: No  . Drug use: No  . Sexual activity: Not on file  Lifestyle  . Physical activity:    Days per week: Not on file    Minutes per session: Not on file  . Stress: Not on file  Relationships  . Social connections:    Talks on phone: Not on file    Gets together: Not on file    Attends religious service: Not on file    Active member of club or organization: Not on file    Attends meetings of clubs or organizations: Not on file    Relationship status: Not on file  Other Topics Concern  . Not on file  Social History Narrative  . Not on file       Objective:   Physical Exam  Constitutional: She is oriented to person, place, and time. She appears well-developed and well-nourished. No distress.  HENT:  Head: Normocephalic and atraumatic.  Right Ear: External ear normal.  Left Ear: External ear normal.  Mouth/Throat: Oropharynx is clear and moist.  Eyes: Pupils are equal, round, and reactive to light.  Neck: Normal range of motion. Neck supple. No thyromegaly present.  Cardiovascular: Normal rate, regular rhythm, normal heart sounds and intact distal pulses.  No murmur heard. Pulmonary/Chest: Effort normal and breath sounds normal. No respiratory distress. She has no wheezes. Right breast exhibits no inverted nipple, no  mass, no nipple discharge, no skin change and no tenderness. Left breast exhibits no inverted nipple, no mass, no nipple discharge, no skin change and no tenderness.  Abdominal: Soft. Bowel sounds are normal. She exhibits no distension. There is no tenderness.  Genitourinary: Vagina normal.  Genitourinary Comments: Bimanual exam- no adnexal masses or tenderness, ovaries nonpalpable   Cervix parous and pink- No discharge   Musculoskeletal: Normal range of motion. She exhibits no edema or tenderness.  Neurological: She is alert and oriented to person, place, and time. She has normal reflexes. No cranial nerve deficit.  Skin: Skin is warm and dry.  Psychiatric: She has a normal mood and affect. Her behavior is normal. Judgment and thought content normal.  Vitals reviewed.     BP 137/83   Pulse 70   Temp (!) 97.3 F (36.3 C) (Oral)   Ht _0  (1.676 m)   Wt 285 lb 3.2 oz (129.4 kg)   BMI 46.03 kg/m      Assessment & Plan:  Shana Zavaleta comes in today with chief complaint of Annual Exam   Diagnosis and orders addressed:  1. Annual physical exam - CMP14+EGFR - CBC with Differential/Platelet - Lipid panel - TSH - VITAMIN D 25 Hydroxy (Vit-D Deficiency, Fractures) - IGP, Aptima HPV, rfx 16/18,45  2. Morbid obesity ([place]) - CMP14+EGFR - CBC with Differential/Platelet  3. Encounter for gynecological examination without abnormal finding - CMP14+EGFR - CBC with Differential/Platelet - IGP, Aptima HPV, rfx 16/18,45  4. Hypothyroidism, unspecified type - CMP14+EGFR - CBC with Differential/Platelet - TSH - levothyroxine (SYNTHROID, LEVOTHROID) 75 MCG tablet; Take 1 tablet (75 mcg total) by mouth every morning. (Needs to be seen before next refill)  Dispense: 90 tablet; Refill: 4  5. Hyperlipidemia, unspecified hyperlipidemia type - CMP14+EGFR - CBC with Differential/Platelet - Lipid panel  6. Vitamin D deficiency - CMP14+EGFR - CBC with Differential/Platelet -  VITAMIN D 25 Hydroxy (Vit-D Deficiency, Fractures)   Labs pending Health Maintenance reviewed Diet and exercise encouraged  Follow up plan: 1 year    Evelina Dun, FNP

## 2018-03-27 ENCOUNTER — Other Ambulatory Visit: Payer: Self-pay | Admitting: Family

## 2018-03-27 LAB — CBC WITH DIFFERENTIAL/PLATELET
Basophils Absolute: 0.1 10*3/uL (ref 0.0–0.2)
Basos: 1 %
EOS (ABSOLUTE): 0.2 10*3/uL (ref 0.0–0.4)
Eos: 2 %
Hematocrit: 41.9 % (ref 34.0–46.6)
Hemoglobin: 14 g/dL (ref 11.1–15.9)
Immature Grans (Abs): 0 10*3/uL (ref 0.0–0.1)
Immature Granulocytes: 1 %
Lymphocytes Absolute: 2.8 10*3/uL (ref 0.7–3.1)
Lymphs: 32 %
MCH: 29.8 pg (ref 26.6–33.0)
MCHC: 33.4 g/dL (ref 31.5–35.7)
MCV: 89 fL (ref 79–97)
Monocytes Absolute: 0.4 10*3/uL (ref 0.1–0.9)
Monocytes: 4 %
Neutrophils Absolute: 5.4 10*3/uL (ref 1.4–7.0)
Neutrophils: 60 %
Platelets: 375 10*3/uL (ref 150–450)
RBC: 4.7 x10E6/uL (ref 3.77–5.28)
RDW: 12.5 % (ref 12.3–15.4)
WBC: 8.8 10*3/uL (ref 3.4–10.8)

## 2018-03-27 LAB — CMP14+EGFR
ALT: 37 IU/L — ABNORMAL HIGH (ref 0–32)
AST: 37 IU/L (ref 0–40)
Albumin/Globulin Ratio: 1.6 (ref 1.2–2.2)
Albumin: 4.5 g/dL (ref 3.6–4.8)
Alkaline Phosphatase: 110 IU/L (ref 39–117)
BUN/Creatinine Ratio: 16 (ref 12–28)
BUN: 13 mg/dL (ref 8–27)
Bilirubin Total: 0.7 mg/dL (ref 0.0–1.2)
CO2: 24 mmol/L (ref 20–29)
Calcium: 9.6 mg/dL (ref 8.7–10.3)
Chloride: 102 mmol/L (ref 96–106)
Creatinine, Ser: 0.83 mg/dL (ref 0.57–1.00)
GFR calc Af Amer: 88 mL/min/{1.73_m2} (ref >59)
GFR calc non Af Amer: 76 mL/min/{1.73_m2} (ref >59)
Globulin, Total: 2.9 g/dL (ref 1.5–4.5)
Glucose: 84 mg/dL (ref 65–99)
Potassium: 4.3 mmol/L (ref 3.5–5.2)
Sodium: 142 mmol/L (ref 134–144)
Total Protein: 7.4 g/dL (ref 6.0–8.5)

## 2018-03-27 LAB — TSH: TSH: 3.33 u[IU]/mL (ref 0.450–4.500)

## 2018-03-27 LAB — LIPID PANEL
Chol/HDL Ratio: 4.4 ratio (ref 0.0–4.4)
Cholesterol, Total: 181 mg/dL (ref 100–199)
HDL: 41 mg/dL (ref >39)
LDL Calculated: 115 mg/dL — ABNORMAL HIGH (ref 0–99)
Triglycerides: 127 mg/dL (ref 0–149)
VLDL Cholesterol Cal: 25 mg/dL (ref 5–40)

## 2018-03-27 LAB — VITAMIN D 25 HYDROXY (VIT D DEFICIENCY, FRACTURES): Vit D, 25-Hydroxy: 21.5 ng/mL — ABNORMAL LOW (ref 30.0–100.0)

## 2018-03-30 ENCOUNTER — Other Ambulatory Visit: Payer: Self-pay | Admitting: Family

## 2018-03-30 MED ORDER — SIMVASTATIN 20 MG PO TABS: ORAL_TABLET | ORAL | 2 refills | Status: DC

## 2018-03-30 MED ORDER — VITAMIN D (ERGOCALCIFEROL) 1.25 MG (50000 UNIT) PO CAPS: 50000.0000 [IU] | ORAL_CAPSULE | ORAL | 0 refills | Status: DC

## 2018-03-31 ENCOUNTER — Encounter: Payer: Self-pay | Admitting: *Deleted

## 2018-03-31 LAB — IGP, APTIMA HPV, RFX 16/18,45
HPV Aptima: NEGATIVE
PAP Smear Comment: 0

## 2018-04-01 ENCOUNTER — Telehealth: Payer: Self-pay | Admitting: *Deleted

## 2018-04-01 NOTE — Telephone Encounter (Signed)
Pt called in and wanted lab results, she is aware of two meds at pharmacy

## 2018-06-19 ENCOUNTER — Other Ambulatory Visit: Payer: Self-pay | Admitting: Family

## 2018-11-02 DIAGNOSIS — H40003 Preglaucoma, unspecified, bilateral: Secondary | ICD-10-CM | POA: Diagnosis not present

## 2018-11-02 DIAGNOSIS — H25811 Combined forms of age-related cataract, right eye: Secondary | ICD-10-CM | POA: Diagnosis not present

## 2018-11-02 DIAGNOSIS — H25812 Combined forms of age-related cataract, left eye: Secondary | ICD-10-CM | POA: Diagnosis not present

## 2018-12-08 ENCOUNTER — Other Ambulatory Visit: Payer: Self-pay | Admitting: Family

## 2018-12-22 ENCOUNTER — Other Ambulatory Visit: Payer: Self-pay | Admitting: Family

## 2019-01-14 ENCOUNTER — Other Ambulatory Visit: Payer: Self-pay | Admitting: Family

## 2019-02-28 ENCOUNTER — Other Ambulatory Visit: Payer: Self-pay | Admitting: Family

## 2019-06-10 ENCOUNTER — Other Ambulatory Visit: Payer: Self-pay | Admitting: Family

## 2019-07-01 ENCOUNTER — Other Ambulatory Visit: Payer: Self-pay

## 2019-07-01 ENCOUNTER — Ambulatory Visit: Payer: BC Managed Care – PPO | Admitting: Family

## 2019-07-01 ENCOUNTER — Ambulatory Visit (HOSPITAL_COMMUNITY)
Admission: RE | Admit: 2019-07-01 | Discharge: 2019-07-01 | Disposition: A | Discharge status: Home / Self Care | Payer: BC Managed Care – PPO | Source: Ambulatory Visit | Attending: Family | Admitting: Family

## 2019-07-01 ENCOUNTER — Encounter: Payer: Self-pay | Admitting: Family

## 2019-07-01 VITALS — BP 133/85 | HR 77 | Temp 98.7°F | Ht 66.0 in | Wt 281.0 lb

## 2019-07-01 DIAGNOSIS — M79662 Pain in left lower leg: Secondary | ICD-10-CM | POA: Insufficient documentation

## 2019-07-01 DIAGNOSIS — E785 Hyperlipidemia, unspecified: Secondary | ICD-10-CM

## 2019-07-01 DIAGNOSIS — E559 Vitamin D deficiency, unspecified: Secondary | ICD-10-CM

## 2019-07-01 DIAGNOSIS — Z0000 Encounter for general adult medical examination without abnormal findings: Secondary | ICD-10-CM | POA: Diagnosis not present

## 2019-07-01 DIAGNOSIS — E039 Hypothyroidism, unspecified: Secondary | ICD-10-CM | POA: Diagnosis not present

## 2019-07-01 NOTE — Patient Instructions (Signed)
Health Maintenance, Female Adopting a healthy lifestyle and getting preventive care are important in promoting health and wellness. Ask your health care provider about:  The right schedule for you to have regular tests and exams.  Things you can do on your own to prevent diseases and keep yourself healthy. What should I know about diet, weight, and exercise? Eat a healthy diet   Eat a diet that includes plenty of vegetables, fruits, low-fat dairy products, and lean protein.  Do not eat a lot of foods that are high in solid fats, added sugars, or sodium. Maintain a healthy weight Body mass index (BMI) is used to identify weight problems. It estimates body fat based on height and weight. Your health care provider can help determine your BMI and help you achieve or maintain a healthy weight. Get regular exercise Get regular exercise. This is one of the most important things you can do for your health. Most adults should:  Exercise for at least 150 minutes each week. The exercise should increase your heart rate and make you sweat (moderate-intensity exercise).  Do strengthening exercises at least twice a week. This is in addition to the moderate-intensity exercise.  Spend less time sitting. Even light physical activity can be beneficial. Watch cholesterol and blood lipids Have your blood tested for lipids and cholesterol at 63 years of age, then have this test every 5 years. Have your cholesterol levels checked more often if:  Your lipid or cholesterol levels are high.  You are older than 63 years of age.  You are at high risk for heart disease. What should I know about cancer screening? Depending on your health history and family history, you may need to have cancer screening at various ages. This may include screening for:  Breast cancer.  Cervical cancer.  Colorectal cancer.  Skin cancer.  Lung cancer. What should I know about heart disease, diabetes, and high blood  pressure? Blood pressure and heart disease  High blood pressure causes heart disease and increases the risk of stroke. This is more likely to develop in people who have high blood pressure readings, are of African descent, or are overweight.  Have your blood pressure checked: ? Every 3-5 years if you are 18-39 years of age. ? Every year if you are 40 years old or older. Diabetes Have regular diabetes screenings. This checks your fasting blood sugar level. Have the screening done:  Once every three years after age 40 if you are at a normal weight and have a low risk for diabetes.  More often and at a younger age if you are overweight or have a high risk for diabetes. What should I know about preventing infection? Hepatitis B If you have a higher risk for hepatitis B, you should be screened for this virus. Talk with your health care provider to find out if you are at risk for hepatitis B infection. Hepatitis C Testing is recommended for:  Everyone born from 1945 through 1965.  Anyone with known risk factors for hepatitis C. Sexually transmitted infections (STIs)  Get screened for STIs, including gonorrhea and chlamydia, if: ? You are sexually active and are younger than 63 years of age. ? You are older than 63 years of age and your health care provider tells you that you are at risk for this type of infection. ? Your sexual activity has changed since you were last screened, and you are at increased risk for chlamydia or gonorrhea. Ask your health care provider if   you are at risk.  Ask your health care provider about whether you are at high risk for HIV. Your health care provider may recommend a prescription medicine to help prevent HIV infection. If you choose to take medicine to prevent HIV, you should first get tested for HIV. You should then be tested every 3 months for as long as you are taking the medicine. Pregnancy  If you are about to stop having your period (premenopausal) and  you may become pregnant, seek counseling before you get pregnant.  Take 400 to 800 micrograms (mcg) of folic acid every day if you become pregnant.  Ask for birth control (contraception) if you want to prevent pregnancy. Osteoporosis and menopause Osteoporosis is a disease in which the bones lose minerals and strength with aging. This can result in bone fractures. If you are 65 years old or older, or if you are at risk for osteoporosis and fractures, ask your health care provider if you should:  Be screened for bone loss.  Take a calcium or vitamin D supplement to lower your risk of fractures.  Be given hormone replacement therapy (HRT) to treat symptoms of menopause. Follow these instructions at home: Lifestyle  Do not use any products that contain nicotine or tobacco, such as cigarettes, e-cigarettes, and chewing tobacco. If you need help quitting, ask your health care provider.  Do not use street drugs.  Do not share needles.  Ask your health care provider for help if you need support or information about quitting drugs. Alcohol use  Do not drink alcohol if: ? Your health care provider tells you not to drink. ? You are pregnant, may be pregnant, or are planning to become pregnant.  If you drink alcohol: ? Limit how much you use to 0-1 drink a day. ? Limit intake if you are breastfeeding.  Be aware of how much alcohol is in your drink. In the [place] one drink equals one 12 oz bottle of beer (355 mL), one 5 oz glass of wine (148 mL), or one 1 oz glass of hard liquor (44 mL). General instructions  Schedule regular health, dental, and eye exams.  Stay current with your vaccines.  Tell your health care provider if: ? You often feel depressed. ? You have ever been abused or do not feel safe at home. Summary  Adopting a healthy lifestyle and getting preventive care are important in promoting health and wellness.  Follow your health care provider's instructions about healthy  diet, exercising, and getting tested or screened for diseases.  Follow your health care provider's instructions on monitoring your cholesterol and blood pressure. This information is not intended to replace advice given to you by your health care provider. Make sure you discuss any questions you have with your health care provider. Document Revised: 05/19/2018 Document Reviewed: 05/19/2018 Elsevier Patient Education  2020 [employer]  

## 2019-07-01 NOTE — Progress Notes (Signed)
Subjective:    Patient ID: Courtney Simmons, female    DOB: [birth year], 64 y.o.   MRN: [id number]  Chief Complaint  Patient presents with  . Medical Management of Chronic Issues    Pt presents to the office today for CPE without pap. PT reports she noticed left calf swelling and pain about two weeks ago. She reports she changed her Zocor to once a week and her pain and swelling has improved. She reports intermittent aching pain of 2 out 10.  Hyperlipidemia This is a chronic problem. The current episode started more than 1 year ago. The problem is uncontrolled. Recent lipid tests were reviewed and are high. Exacerbating diseases include obesity. Current antihyperlipidemic treatment includes statins. The current treatment provides mild improvement of lipids. Risk factors for coronary artery disease include dyslipidemia, hypertension and post-menopausal.  Thyroid Problem Presents for follow-up visit. Patient reports no depressed mood, diarrhea, dry skin or fatigue. The symptoms have been stable. Her past medical history is significant for hyperlipidemia.      Review of Systems  Constitutional: Negative for fatigue.  Gastrointestinal: Negative for diarrhea.  All other systems reviewed and are negative.  Family History  Problem Relation Age of Onset  . Heart attack Mother 36       died 44  . Hypertension Mother   . Diabetes Mother   . Gout Father   . Coronary artery disease Father   . Diabetes Sister   . Stroke Sister   . Coronary artery disease Sister        died age 67  . Obesity Sister   . Cancer Brother        died age 64  . Stroke Brother 18  . Colon cancer Neg Hx   . Esophageal cancer Neg Hx   . Rectal cancer Neg Hx   . Stomach cancer Neg Hx    Social History   Socioeconomic History  . Marital status: Married    Spouse name: Not on file  . Number of children: Not on file  . Years of education: Not on file  . Highest education level: Not on file  Occupational  History  . Occupation: Theme park manager: [employer]  Tobacco Use  . Smoking status: Never Smoker  . Smokeless tobacco: Never Used  Substance and Sexual Activity  . Alcohol use: No  . Drug use: No  . Sexual activity: Not on file  Other Topics Concern  . Not on file  Social History Narrative  . Not on file   Social Determinants of Health   Financial Resource Strain:   . Difficulty of Paying Living Expenses: Not on file  Food Insecurity:   . Worried About Charity fundraiser in the Last Year: Not on file  . Ran Out of Food in the Last Year: Not on file  Transportation Needs:   . Lack of Transportation (Medical): Not on file  . Lack of Transportation (Non-Medical): Not on file  Physical Activity:   . Days of Exercise per Week: Not on file  . Minutes of Exercise per Session: Not on file  Stress:   . Feeling of Stress : Not on file  Social Connections:   . Frequency of Communication with Friends and Family: Not on file  . Frequency of Social Gatherings with Friends and Family: Not on file  . Attends Religious Services: Not on file  . Active Member of Clubs or Organizations: Not on file  .  Attends Archivist Meetings: Not on file  . Marital Status: Not on file        Objective:   Physical Exam Vitals reviewed.  Constitutional:      General: She is not in acute distress.    Appearance: She is well-developed. She is obese.  HENT:     Head: Normocephalic and atraumatic.     Right Ear: Tympanic membrane normal.     Left Ear: Tympanic membrane normal.  Eyes:     Pupils: Pupils are equal, round, and reactive to light.  Neck:     Thyroid: No thyromegaly.  Cardiovascular:     Rate and Rhythm: Normal rate and regular rhythm.     Heart sounds: Normal heart sounds. No murmur.  Pulmonary:     Effort: Pulmonary effort is normal. No respiratory distress.     Breath sounds: Normal breath sounds. No wheezing.  Abdominal:     General: Bowel sounds are normal.  There is no distension.     Palpations: Abdomen is soft.     Tenderness: There is no abdominal tenderness.  Musculoskeletal:        General: No tenderness. Normal range of motion.     Cervical back: Normal range of motion and neck supple.  Skin:    General: Skin is warm and dry.  Neurological:     Mental Status: She is alert and oriented to person, place, and time.     Cranial Nerves: No cranial nerve deficit.     Deep Tendon Reflexes: Reflexes are normal and symmetric.  Psychiatric:        Behavior: Behavior normal.        Thought Content: Thought content normal.        Judgment: Judgment normal.       BP 133/85   Pulse 77   Temp 98.7 F (37.1 C) (Oral)   Ht '5\' 6"'$  (1.676 m)   Wt 281 lb (127.5 kg)   BMI 45.35 kg/m      Assessment & Plan:  Courtney Simmons comes in today with chief complaint of Medical Management of Chronic Issues   Diagnosis and orders addressed:  1. Hyperlipidemia, unspecified hyperlipidemia type - CMP14+EGFR - CBC with Differential/Platelet  2. Hypothyroidism, unspecified type - CMP14+EGFR - CBC with Differential/Platelet  3. Morbid obesity ([place]) - CMP14+EGFR - CBC with Differential/Platelet  4. Vitamin D deficiency - CMP14+EGFR - CBC with Differential/Platelet  5. Annual physical exam - CMP14+EGFR - CBC with Differential/Platelet - Lipid panel - VITAMIN D 25 Hydroxy (Vit-D Deficiency, Fractures) - TSH  6. Pain of left calf Doppler pending - US Venous Img Lower Unilateral Left; Future   Labs pending Health Maintenance reviewed Diet and exercise encouraged  Follow up plan: 1 year    Evelina Dun, FNP

## 2019-07-02 LAB — CMP14+EGFR
ALT: 21 IU/L (ref 0–32)
AST: 22 IU/L (ref 0–40)
Albumin/Globulin Ratio: 1.4 (ref 1.2–2.2)
Albumin: 4.1 g/dL (ref 3.8–4.8)
Alkaline Phosphatase: 151 IU/L — ABNORMAL HIGH (ref 39–117)
BUN/Creatinine Ratio: 20 (ref 12–28)
BUN: 16 mg/dL (ref 8–27)
Bilirubin Total: 0.5 mg/dL (ref 0.0–1.2)
CO2: 23 mmol/L (ref 20–29)
Calcium: 9.7 mg/dL (ref 8.7–10.3)
Chloride: 106 mmol/L (ref 96–106)
Creatinine, Ser: 0.82 mg/dL (ref 0.57–1.00)
GFR calc Af Amer: 89 mL/min/{1.73_m2} (ref >59)
GFR calc non Af Amer: 77 mL/min/{1.73_m2} (ref >59)
Globulin, Total: 3 g/dL (ref 1.5–4.5)
Glucose: 85 mg/dL (ref 65–99)
Potassium: 4.3 mmol/L (ref 3.5–5.2)
Sodium: 142 mmol/L (ref 134–144)
Total Protein: 7.1 g/dL (ref 6.0–8.5)

## 2019-07-02 LAB — CBC WITH DIFFERENTIAL/PLATELET
Basophils Absolute: 0.1 10*3/uL (ref 0.0–0.2)
Basos: 1 %
EOS (ABSOLUTE): 0.2 10*3/uL (ref 0.0–0.4)
Eos: 2 %
Hematocrit: 41.3 % (ref 34.0–46.6)
Hemoglobin: 13.6 g/dL (ref 11.1–15.9)
Immature Grans (Abs): 0 10*3/uL (ref 0.0–0.1)
Immature Granulocytes: 0 %
Lymphocytes Absolute: 3.2 10*3/uL — ABNORMAL HIGH (ref 0.7–3.1)
Lymphs: 29 %
MCH: 28.7 pg (ref 26.6–33.0)
MCHC: 32.9 g/dL (ref 31.5–35.7)
MCV: 87 fL (ref 79–97)
Monocytes Absolute: 0.5 10*3/uL (ref 0.1–0.9)
Monocytes: 4 %
Neutrophils Absolute: 7.2 10*3/uL — ABNORMAL HIGH (ref 1.4–7.0)
Neutrophils: 64 %
Platelets: 335 10*3/uL (ref 150–450)
RBC: 4.74 x10E6/uL (ref 3.77–5.28)
RDW: 12.8 % (ref 11.7–15.4)
WBC: 11.1 10*3/uL — ABNORMAL HIGH (ref 3.4–10.8)

## 2019-07-02 LAB — LIPID PANEL
Chol/HDL Ratio: 4.1 ratio (ref 0.0–4.4)
Cholesterol, Total: 174 mg/dL (ref 100–199)
HDL: 42 mg/dL (ref >39)
LDL Chol Calc (NIH): 109 mg/dL — ABNORMAL HIGH (ref 0–99)
Triglycerides: 129 mg/dL (ref 0–149)
VLDL Cholesterol Cal: 23 mg/dL (ref 5–40)

## 2019-07-02 LAB — VITAMIN D 25 HYDROXY (VIT D DEFICIENCY, FRACTURES): Vit D, 25-Hydroxy: 39.3 ng/mL (ref 30.0–100.0)

## 2019-07-02 LAB — TSH: TSH: 0.065 u[IU]/mL — ABNORMAL LOW (ref 0.450–4.500)

## 2019-07-05 ENCOUNTER — Other Ambulatory Visit: Payer: Self-pay | Admitting: Family

## 2019-07-05 MED ORDER — LEVOTHYROXINE SODIUM 50 MCG PO TABS: 50.0000 ug | ORAL_TABLET | Freq: Every day | ORAL | 4 refills | Status: AC

## 2019-07-06 ENCOUNTER — Telehealth: Payer: Self-pay | Admitting: Family

## 2019-07-06 NOTE — Telephone Encounter (Signed)
[email or web address]

## 2019-07-11 ENCOUNTER — Encounter: Payer: Self-pay | Admitting: Family Medicine

## 2019-07-14 ENCOUNTER — Encounter: Payer: Self-pay | Admitting: Family Medicine

## 2020-02-24 ENCOUNTER — Encounter: Payer: Self-pay | Admitting: Family Medicine

## 2020-02-24 ENCOUNTER — Ambulatory Visit (INDEPENDENT_AMBULATORY_CARE_PROVIDER_SITE_OTHER): Payer: BC Managed Care – PPO | Admitting: Family Medicine

## 2020-02-24 DIAGNOSIS — J069 Acute upper respiratory infection, unspecified: Secondary | ICD-10-CM | POA: Diagnosis not present

## 2020-02-24 MED ORDER — PREDNISONE 10 MG PO TABS
ORAL_TABLET | ORAL | 0 refills | Status: DC
Start: 2020-02-24 — End: 2020-06-17

## 2020-02-24 NOTE — Progress Notes (Signed)
° ° °  Subjective:    Patient ID: Courtney Simmons, female    DOB: 1956/02/02, 64 y.o.   MRN: [id number]   HPI: Courtney Simmons is a 64 y.o. female presenting for Symptoms include congestion, facial pain, nasal congestion, some non productive cough especially when she lays down, post nasal drip. There is no fever, chills, or sweats. No known CoVID exposure. Denies dyspnea. Onset of symptoms was 4 days ago, stable since then.    Depression screen [facility] 2/9 07/01/2019 01/23/2017 12/21/2015  Decreased Interest 0 0 0  Down, Depressed, Hopeless 0 0 0  PHQ - 2 Score 0 0 0     Relevant past medical, surgical, family and social history reviewed and updated as indicated.  Interim medical history since our last visit reviewed. Allergies and medications reviewed and updated.  ROS:  Review of Systems  Constitutional: Negative for activity change, appetite change, chills and fever.  HENT: Positive for congestion, postnasal drip and rhinorrhea. Negative for ear pain, hearing loss, nosebleeds, sinus pressure and trouble swallowing.   Respiratory: Negative for chest tightness and shortness of breath.   Cardiovascular: Negative for chest pain.  Skin: Negative for rash.     Social History   Tobacco Use  Smoking Status Never Smoker  Smokeless Tobacco Never Used       Objective:     Wt Readings from Last 3 Encounters:  07/01/19 281 lb (127.5 kg)  03/26/18 285 lb 3.2 oz (129.4 kg)  01/23/17 284 lb (128.8 kg)     Exam deferred. Pt. Harboring due to COVID 19. Phone visit performed.   Assessment & Plan:   1. Upper respiratory tract infection, unspecified type     Meds ordered this encounter  Medications   predniSONE (DELTASONE) 10 MG tablet    Sig: Take 5 daily for 2 days followed by 4,3,2 and 1 for 2 days each.    Dispense:  30 tablet    Refill:  0    No orders of the defined types were placed in this encounter.     Diagnoses and all orders for this visit:  Upper respiratory  tract infection, unspecified type  Other orders -     predniSONE (DELTASONE) 10 MG tablet; Take 5 daily for 2 days followed by 4,3,2 and 1 for 2 days each.    Virtual Visit via telephone Note  I discussed the limitations, risks, security and privacy concerns of performing an evaluation and management service by telephone and the availability of in person appointments. The patient was identified with two identifiers. Pt.expressed understanding and agreed to proceed. Pt. Is at home. Dr. Darlyn Read is in his office.  Follow Up Instructions:   I discussed the assessment and treatment plan with the patient. The patient was provided an opportunity to ask questions and all were answered. The patient agreed with the plan and demonstrated an understanding of the instructions.   The patient was advised to call back or seek an in-person evaluation if the symptoms worsen or if the condition fails to improve as anticipated.   Total minutes including chart review and phone contact time: 15   Follow up plan: Return if symptoms worsen or fail to improve.  Mechele Claude, MD [place] [facility]

## 2020-05-08 DIAGNOSIS — I83892 Varicose veins of left lower extremities with other complications: Secondary | ICD-10-CM | POA: Diagnosis not present

## 2020-05-08 DIAGNOSIS — I8312 Varicose veins of left lower extremity with inflammation: Secondary | ICD-10-CM | POA: Diagnosis not present

## 2020-05-21 DIAGNOSIS — I83892 Varicose veins of left lower extremities with other complications: Secondary | ICD-10-CM | POA: Diagnosis not present

## 2020-05-21 DIAGNOSIS — I8312 Varicose veins of left lower extremity with inflammation: Secondary | ICD-10-CM | POA: Diagnosis not present

## 2020-05-29 DIAGNOSIS — I8312 Varicose veins of left lower extremity with inflammation: Secondary | ICD-10-CM | POA: Diagnosis not present

## 2020-05-29 DIAGNOSIS — I83892 Varicose veins of left lower extremities with other complications: Secondary | ICD-10-CM | POA: Diagnosis not present

## 2020-06-14 ENCOUNTER — Ambulatory Visit (INDEPENDENT_AMBULATORY_CARE_PROVIDER_SITE_OTHER): Payer: BC Managed Care – PPO | Admitting: Nurse Practitioner

## 2020-06-14 DIAGNOSIS — K591 Functional diarrhea: Secondary | ICD-10-CM | POA: Diagnosis not present

## 2020-06-14 NOTE — Progress Notes (Signed)
   Virtual Visit via telephone Note Due to COVID-19 pandemic this visit was conducted virtually. This visit type was conducted due to national recommendations for restrictions regarding the COVID-19 Pandemic (e.g. social distancing, sheltering in place) in an effort to limit this patient's exposure and mitigate transmission in our community. All issues noted in this document were discussed and addressed.  A physical exam was not performed with this format.  I connected with Courtney Simmons on 06/14/20 at 9:25 by telephone and verified that I am speaking with the correct person using two identifiers. Courtney Simmons is currently located at home and no one is currently with her during visit. The provider, Mary-Margaret Daphine Deutscher, FNP is located in their office at time of visit.  I discussed the limitations, risks, security and privacy concerns of performing an evaluation and management service by telephone and the availability of in person appointments. I also discussed with the patient that there may be a patient responsible charge related to this service. The patient expressed understanding and agreed to proceed.   History and Present Illness:   Chief Complaint: Diarrhea   HPI Patient calls in today for telephone visit c/o diarrhea since December 31. She goes 4-5 x a day. Was pure water. She has not gone since 2AM this morning. She has tried butter milk and gingerale. She denies nausea and vomiting. Has had intermittent fever off and on for 3 weeks. Highest was 99.8 oral.   Review of Systems  Constitutional: Positive for fever (99.8 highest) and malaise/fatigue. Negative for chills.  Respiratory: Negative.   Cardiovascular: Negative.   Genitourinary: Negative.  Negative for dysuria, frequency and urgency.  Neurological: Negative.   Psychiatric/Behavioral: Negative.   All other systems reviewed and are negative.    Observations/Objective: Alert and oriented- answers all questions  appropriately No distress Feels weak   Assessment and Plan: Courtney Simmons in today with chief complaint of Diarrhea   1. Functional diarrhea Imodium AD OTC Force fluids Call if no better.    Follow Up Instructions: prn    I discussed the assessment and treatment plan with the patient. The patient was provided an opportunity to ask questions and all were answered. The patient agreed with the plan and demonstrated an understanding of the instructions.   The patient was advised to call back or seek an in-person evaluation if the symptoms worsen or if the condition fails to improve as anticipated.  The above assessment and management plan was discussed with the patient. The patient verbalized understanding of and has agreed to the management plan. Patient is aware to call the clinic if symptoms persist or worsen. Patient is aware when to return to the clinic for a follow-up visit. Patient educated on when it is appropriate to go to the emergency department.   Time call ended:  9:39 I provided 14 minutes of non-face-to-face time during this encounter.    Mary-Margaret Daphine Deutscher, FNP

## 2020-06-16 ENCOUNTER — Encounter (HOSPITAL_COMMUNITY): Payer: Self-pay

## 2020-06-16 ENCOUNTER — Emergency Department (HOSPITAL_COMMUNITY): Payer: BC Managed Care – PPO

## 2020-06-16 ENCOUNTER — Other Ambulatory Visit: Payer: Self-pay

## 2020-06-16 ENCOUNTER — Inpatient Hospital Stay (HOSPITAL_COMMUNITY)
Admission: EM | Admit: 2020-06-16 | Discharge: 2020-07-10 | DRG: 207 | Disposition: E | Payer: BC Managed Care – PPO | Attending: Internal Medicine | Admitting: Internal Medicine

## 2020-06-16 DIAGNOSIS — I480 Paroxysmal atrial fibrillation: Secondary | ICD-10-CM | POA: Diagnosis not present

## 2020-06-16 DIAGNOSIS — I82411 Acute embolism and thrombosis of right femoral vein: Secondary | ICD-10-CM | POA: Diagnosis not present

## 2020-06-16 DIAGNOSIS — Z66 Do not resuscitate: Secondary | ICD-10-CM | POA: Diagnosis not present

## 2020-06-16 DIAGNOSIS — Z833 Family history of diabetes mellitus: Secondary | ICD-10-CM

## 2020-06-16 DIAGNOSIS — Z6841 Body Mass Index (BMI) 40.0 and over, adult: Secondary | ICD-10-CM

## 2020-06-16 DIAGNOSIS — Z79890 Hormone replacement therapy (postmenopausal): Secondary | ICD-10-CM

## 2020-06-16 DIAGNOSIS — R0689 Other abnormalities of breathing: Secondary | ICD-10-CM | POA: Diagnosis not present

## 2020-06-16 DIAGNOSIS — T45515A Adverse effect of anticoagulants, initial encounter: Secondary | ICD-10-CM | POA: Diagnosis not present

## 2020-06-16 DIAGNOSIS — R197 Diarrhea, unspecified: Secondary | ICD-10-CM | POA: Diagnosis present

## 2020-06-16 DIAGNOSIS — A419 Sepsis, unspecified organism: Secondary | ICD-10-CM | POA: Diagnosis not present

## 2020-06-16 DIAGNOSIS — E875 Hyperkalemia: Secondary | ICD-10-CM | POA: Diagnosis not present

## 2020-06-16 DIAGNOSIS — R0602 Shortness of breath: Secondary | ICD-10-CM | POA: Diagnosis not present

## 2020-06-16 DIAGNOSIS — E785 Hyperlipidemia, unspecified: Secondary | ICD-10-CM | POA: Diagnosis not present

## 2020-06-16 DIAGNOSIS — J80 Acute respiratory distress syndrome: Secondary | ICD-10-CM | POA: Diagnosis present

## 2020-06-16 DIAGNOSIS — E860 Dehydration: Secondary | ICD-10-CM | POA: Diagnosis present

## 2020-06-16 DIAGNOSIS — J9600 Acute respiratory failure, unspecified whether with hypoxia or hypercapnia: Secondary | ICD-10-CM

## 2020-06-16 DIAGNOSIS — Z515 Encounter for palliative care: Secondary | ICD-10-CM

## 2020-06-16 DIAGNOSIS — E870 Hyperosmolality and hypernatremia: Secondary | ICD-10-CM | POA: Diagnosis not present

## 2020-06-16 DIAGNOSIS — J1282 Pneumonia due to coronavirus disease 2019: Secondary | ICD-10-CM | POA: Diagnosis present

## 2020-06-16 DIAGNOSIS — Z978 Presence of other specified devices: Secondary | ICD-10-CM

## 2020-06-16 DIAGNOSIS — R7989 Other specified abnormal findings of blood chemistry: Secondary | ICD-10-CM | POA: Diagnosis present

## 2020-06-16 DIAGNOSIS — D6832 Hemorrhagic disorder due to extrinsic circulating anticoagulants: Secondary | ICD-10-CM | POA: Diagnosis not present

## 2020-06-16 DIAGNOSIS — I82441 Acute embolism and thrombosis of right tibial vein: Secondary | ICD-10-CM | POA: Diagnosis not present

## 2020-06-16 DIAGNOSIS — Z79899 Other long term (current) drug therapy: Secondary | ICD-10-CM

## 2020-06-16 DIAGNOSIS — E872 Acidosis: Secondary | ICD-10-CM | POA: Diagnosis not present

## 2020-06-16 DIAGNOSIS — R404 Transient alteration of awareness: Secondary | ICD-10-CM | POA: Diagnosis not present

## 2020-06-16 DIAGNOSIS — U071 COVID-19: Secondary | ICD-10-CM | POA: Diagnosis not present

## 2020-06-16 DIAGNOSIS — Z9911 Dependence on respirator [ventilator] status: Secondary | ICD-10-CM

## 2020-06-16 DIAGNOSIS — I82431 Acute embolism and thrombosis of right popliteal vein: Secondary | ICD-10-CM | POA: Diagnosis not present

## 2020-06-16 DIAGNOSIS — R739 Hyperglycemia, unspecified: Secondary | ICD-10-CM | POA: Diagnosis not present

## 2020-06-16 DIAGNOSIS — R6521 Severe sepsis with septic shock: Secondary | ICD-10-CM | POA: Diagnosis not present

## 2020-06-16 DIAGNOSIS — K921 Melena: Secondary | ICD-10-CM | POA: Diagnosis not present

## 2020-06-16 DIAGNOSIS — Y95 Nosocomial condition: Secondary | ICD-10-CM | POA: Diagnosis not present

## 2020-06-16 DIAGNOSIS — E878 Other disorders of electrolyte and fluid balance, not elsewhere classified: Secondary | ICD-10-CM | POA: Diagnosis not present

## 2020-06-16 DIAGNOSIS — R001 Bradycardia, unspecified: Secondary | ICD-10-CM | POA: Diagnosis present

## 2020-06-16 DIAGNOSIS — R0902 Hypoxemia: Secondary | ICD-10-CM

## 2020-06-16 DIAGNOSIS — E039 Hypothyroidism, unspecified: Secondary | ICD-10-CM | POA: Diagnosis not present

## 2020-06-16 DIAGNOSIS — D62 Acute posthemorrhagic anemia: Secondary | ICD-10-CM | POA: Diagnosis present

## 2020-06-16 DIAGNOSIS — Z87442 Personal history of urinary calculi: Secondary | ICD-10-CM

## 2020-06-16 DIAGNOSIS — J9601 Acute respiratory failure with hypoxia: Secondary | ICD-10-CM | POA: Diagnosis not present

## 2020-06-16 DIAGNOSIS — I82461 Acute embolism and thrombosis of right calf muscular vein: Secondary | ICD-10-CM | POA: Diagnosis not present

## 2020-06-16 DIAGNOSIS — Z823 Family history of stroke: Secondary | ICD-10-CM

## 2020-06-16 DIAGNOSIS — Z8249 Family history of ischemic heart disease and other diseases of the circulatory system: Secondary | ICD-10-CM

## 2020-06-16 DIAGNOSIS — J189 Pneumonia, unspecified organism: Secondary | ICD-10-CM | POA: Diagnosis not present

## 2020-06-16 DIAGNOSIS — R031 Nonspecific low blood-pressure reading: Secondary | ICD-10-CM | POA: Diagnosis not present

## 2020-06-16 DIAGNOSIS — Z01818 Encounter for other preprocedural examination: Secondary | ICD-10-CM

## 2020-06-16 DIAGNOSIS — N179 Acute kidney failure, unspecified: Secondary | ICD-10-CM | POA: Diagnosis not present

## 2020-06-16 DIAGNOSIS — R0603 Acute respiratory distress: Secondary | ICD-10-CM

## 2020-06-16 DIAGNOSIS — R918 Other nonspecific abnormal finding of lung field: Secondary | ICD-10-CM | POA: Diagnosis not present

## 2020-06-16 LAB — CBC WITH DIFFERENTIAL/PLATELET
Abs Immature Granulocytes: 0.15 10*3/uL — ABNORMAL HIGH (ref 0.00–0.07)
Basophils Absolute: 0 10*3/uL (ref 0.0–0.1)
Basophils Relative: 0 %
Eosinophils Absolute: 0 10*3/uL (ref 0.0–0.5)
Eosinophils Relative: 0 %
HCT: 43.6 % (ref 36.0–46.0)
Hemoglobin: 13.6 g/dL (ref 12.0–15.0)
Immature Granulocytes: 2 %
Lymphocytes Relative: 12 %
Lymphs Abs: 1 10*3/uL (ref 0.7–4.0)
MCH: 29.4 pg (ref 26.0–34.0)
MCHC: 31.2 g/dL (ref 30.0–36.0)
MCV: 94.4 fL (ref 80.0–100.0)
Monocytes Absolute: 0.3 10*3/uL (ref 0.1–1.0)
Monocytes Relative: 4 %
Neutro Abs: 6.8 10*3/uL (ref 1.7–7.7)
Neutrophils Relative %: 82 %
Platelets: 339 10*3/uL (ref 150–400)
RBC: 4.62 MIL/uL (ref 3.87–5.11)
RDW: 14.4 % (ref 11.5–15.5)
WBC: 8.3 10*3/uL (ref 4.0–10.5)
nRBC: 0.2 % (ref 0.0–0.2)

## 2020-06-16 LAB — COMPREHENSIVE METABOLIC PANEL
ALT: 42 U/L (ref 0–44)
AST: 69 U/L — ABNORMAL HIGH (ref 15–41)
Albumin: 3.2 g/dL — ABNORMAL LOW (ref 3.5–5.0)
Alkaline Phosphatase: 98 U/L (ref 38–126)
Anion gap: 13 (ref 5–15)
BUN: 49 mg/dL — ABNORMAL HIGH (ref 8–23)
CO2: 20 mmol/L — ABNORMAL LOW (ref 22–32)
Calcium: 8.5 mg/dL — ABNORMAL LOW (ref 8.9–10.3)
Chloride: 109 mmol/L (ref 98–111)
Creatinine, Ser: 1.7 mg/dL — ABNORMAL HIGH (ref 0.44–1.00)
GFR, Estimated: 33 mL/min — ABNORMAL LOW (ref >60)
Glucose, Bld: 102 mg/dL — ABNORMAL HIGH (ref 70–99)
Potassium: 3.9 mmol/L (ref 3.5–5.1)
Sodium: 142 mmol/L (ref 135–145)
Total Bilirubin: 0.7 mg/dL (ref 0.3–1.2)
Total Protein: 7.9 g/dL (ref 6.5–8.1)

## 2020-06-16 LAB — D-DIMER, QUANTITATIVE: D-Dimer, Quant: 1.05 ug/mL-FEU — ABNORMAL HIGH (ref 0.00–0.50)

## 2020-06-16 LAB — FIBRINOGEN: Fibrinogen: >800 mg/dL — ABNORMAL HIGH (ref 210–475)

## 2020-06-16 LAB — TSH: TSH: 1.653 u[IU]/mL (ref 0.350–4.500)

## 2020-06-16 LAB — TRIGLYCERIDES: Triglycerides: 205 mg/dL — ABNORMAL HIGH (ref <150)

## 2020-06-16 LAB — C-REACTIVE PROTEIN: CRP: 23.1 mg/dL — ABNORMAL HIGH (ref <1.0)

## 2020-06-16 LAB — PROCALCITONIN: Procalcitonin: 0.69 ng/mL

## 2020-06-16 LAB — RESP PANEL BY RT-PCR (FLU A&B, COVID) ARPGX2
Influenza A by PCR: NEGATIVE
Influenza B by PCR: NEGATIVE
SARS Coronavirus 2 by RT PCR: POSITIVE — AB

## 2020-06-16 LAB — FERRITIN: Ferritin: 1192 ng/mL — ABNORMAL HIGH (ref 11–307)

## 2020-06-16 LAB — LACTATE DEHYDROGENASE: LDH: 443 U/L — ABNORMAL HIGH (ref 98–192)

## 2020-06-16 MED ORDER — ONDANSETRON HCL 4 MG/2ML IJ SOLN
4.0000 mg | Freq: Four times a day (QID) | INTRAMUSCULAR | Status: DC | PRN
  Administered 2020-06-17: 4 mg via INTRAVENOUS
  Filled 2020-06-16: qty 2

## 2020-06-16 MED ORDER — SODIUM CHLORIDE 0.9 % IV SOLN
100.0000 mg | Freq: Every day | INTRAVENOUS | Status: AC
  Administered 2020-06-17 – 2020-06-20 (×4): 100 mg via INTRAVENOUS
  Filled 2020-06-16: qty 2.5
  Filled 2020-06-16 (×3): qty 20

## 2020-06-16 MED ORDER — LEVOTHYROXINE SODIUM 50 MCG PO TABS
50.0000 ug | ORAL_TABLET | Freq: Every day | ORAL | Status: DC
  Filled 2020-06-16: qty 1

## 2020-06-16 MED ORDER — ALBUTEROL SULFATE HFA 108 (90 BASE) MCG/ACT IN AERS
2.0000 | INHALATION_SPRAY | Freq: Four times a day (QID) | RESPIRATORY_TRACT | Status: DC
  Administered 2020-06-16: 2 via RESPIRATORY_TRACT
  Filled 2020-06-16: qty 6.7

## 2020-06-16 MED ORDER — DEXAMETHASONE SODIUM PHOSPHATE 10 MG/ML IJ SOLN
6.0000 mg | Freq: Once | INTRAMUSCULAR | Status: AC
  Administered 2020-06-16: 6 mg via INTRAVENOUS
  Filled 2020-06-16: qty 1

## 2020-06-16 MED ORDER — SODIUM CHLORIDE 0.9 % IV SOLN
100.0000 mg | INTRAVENOUS | Status: AC
  Filled 2020-06-16: qty 20

## 2020-06-16 MED ORDER — ALBUTEROL SULFATE HFA 108 (90 BASE) MCG/ACT IN AERS
2.0000 | INHALATION_SPRAY | Freq: Four times a day (QID) | RESPIRATORY_TRACT | Status: DC
  Filled 2020-06-16: qty 6.7

## 2020-06-16 MED ORDER — GUAIFENESIN-DM 100-10 MG/5ML PO SYRP: 10.0000 mL | ORAL_SOLUTION | ORAL | Status: DC | PRN

## 2020-06-16 MED ORDER — ASCORBIC ACID 500 MG PO TABS
500.0000 mg | ORAL_TABLET | Freq: Every day | ORAL | Status: DC
  Administered 2020-06-16 – 2020-06-18 (×2): 500 mg via ORAL
  Filled 2020-06-16 (×2): qty 1

## 2020-06-16 MED ORDER — DEXAMETHASONE SODIUM PHOSPHATE 4 MG/ML IJ SOLN
4.0000 mg | Freq: Once | INTRAMUSCULAR | Status: AC
  Administered 2020-06-16: 4 mg via INTRAVENOUS
  Filled 2020-06-16: qty 1

## 2020-06-16 MED ORDER — HEPARIN SODIUM (PORCINE) 5000 UNIT/ML IJ SOLN
5000.0000 [IU] | Freq: Three times a day (TID) | INTRAMUSCULAR | Status: DC
  Administered 2020-06-16 – 2020-06-18 (×6): 5000 [IU] via SUBCUTANEOUS
  Filled 2020-06-16 (×6): qty 1

## 2020-06-16 MED ORDER — DEXAMETHASONE SODIUM PHOSPHATE 10 MG/ML IJ SOLN: 6.0000 mg | INTRAMUSCULAR | Status: DC

## 2020-06-16 MED ORDER — ONDANSETRON HCL 4 MG PO TABS: 4.0000 mg | ORAL_TABLET | Freq: Four times a day (QID) | ORAL | Status: DC | PRN

## 2020-06-16 MED ORDER — SODIUM CHLORIDE 0.9 % IV SOLN
1000.0000 mL | INTRAVENOUS | Status: DC
  Administered 2020-06-16: 1000 mL via INTRAVENOUS

## 2020-06-16 MED ORDER — ACETAMINOPHEN 325 MG PO TABS: 650.0000 mg | ORAL_TABLET | Freq: Four times a day (QID) | ORAL | Status: DC | PRN

## 2020-06-16 MED ORDER — SODIUM CHLORIDE 0.9 % IV SOLN
1000.0000 mL | INTRAVENOUS | Status: DC
  Administered 2020-06-16 – 2020-06-17 (×2): 1000 mL via INTRAVENOUS

## 2020-06-16 MED ORDER — ZINC SULFATE 220 (50 ZN) MG PO CAPS
220.0000 mg | ORAL_CAPSULE | Freq: Every day | ORAL | Status: DC
  Administered 2020-06-16 – 2020-06-18 (×2): 220 mg via ORAL
  Filled 2020-06-16 (×2): qty 1

## 2020-06-16 NOTE — H&P (Addendum)
History and Physical    [name] ZOX:[id number] DOB: 1956/04/05 DOA: 07-04-2020  PCP: Courtney Spencer, FNP   Patient coming from: Home  I have personally briefly reviewed patient's old medical records in [facility]  Chief Complaint: SOB  HPI: Courtney Simmons is a 65 y.o. female with medical history significant for morbid obesity, BMI of 43, and hypothyroidism.  Patient presented to the ED via EMS with complaints of increasing difficulty breathing.  She has also had watery diarrhea, and 1 or 2 episodes of vomiting.  Has been sick since 06/08/20-New Year's Eve.  Patient is not vaccinated.  She has not had a Covid test per to arrival in the ED today. Denies chest pain.   Patient had a televisit with her primary care provider- 1/6-at that time her complaints was diarrhea, intermittent fevers-notes fevers have been intermittent for 3 weeks.  Highest temperature 99.8.  ED Course: O2 sats 39% and was placed on 15 L nonrebreather.  Covid test positive.  Creatinine elevated 1.7, baseline 0.7-0.8.  Serum bicarb - 20.  Chest x-ray shows diffuse bilateral pulmonary infiltrates right greater than left, worrisome for multifocal pneumonia.  Dexamethasone 6 mg given, remdesivir started..  1 L bolus given.  Hospitalist to admit for Covid pneumonia.  Review of Systems: As per HPI all other systems reviewed and negative.  Past Medical History:  Diagnosis Date  . Hyperlipidemia   . Hypothyroidism   . Kidney stone   . Morbid obesity (HCC) 01/02/2012  . Obesity     Past Surgical History:  Procedure Laterality Date  . BACK SURGERY  1992   no prolems with surgery  . BREAST BIOPSY    . cyst removed from throat  1980s  . ORIF ANKLE FRACTURE  01/02/2012   Procedure: OPEN REDUCTION INTERNAL FIXATION (ORIF) ANKLE FRACTURE;  Surgeon: Darreld Mclean, MD;  Location: [facility];  Service: Orthopedics;  Laterality: Left;     reports that she has never smoked. She has never used smokeless tobacco. She  reports that she does not drink alcohol and does not use drugs.  No Known Allergies  Family History  Problem Relation Age of Onset  . Heart attack Mother 50       died 74  . Hypertension Mother   . Diabetes Mother   . Gout Father   . Coronary artery disease Father   . Diabetes Sister   . Stroke Sister   . Coronary artery disease Sister        died age 19  . Obesity Sister   . Cancer Brother        died age 85  . Stroke Brother 86  . Colon cancer Neg Hx   . Esophageal cancer Neg Hx   . Rectal cancer Neg Hx   . Stomach cancer Neg Hx     Prior to Admission medications   Medication Sig Start Date End Date Taking? Authorizing Provider  acetaminophen (TYLENOL) 325 MG tablet Take 650 mg by mouth every 6 (six) hours as needed.    [provider]  ibuprofen (ADVIL) 200 MG tablet Take 200 mg by mouth every 6 (six) hours as needed.    [provider]  levothyroxine (SYNTHROID) 50 MCG tablet Take 1 tablet (50 mcg total) by mouth daily. 07/05/19 07/04/20  Courtney Spencer, FNP  predniSONE (DELTASONE) 10 MG tablet Take 5 daily for 2 days followed by 4,3,2 and 1 for 2 days each. 02/24/20   Mechele Claude, MD  simvastatin (ZOCOR) 20 MG tablet TAKE 1 TABLET BY MOUTH EVERYDAY AT BEDTIME (NEEDS TO BE SEEN BEFORE NEXT REFILL) 01/14/19   Courtney Spencer, FNP  Vitamin D, Ergocalciferol, (DRISDOL) 1.25 MG (50000 UT) CAPS capsule TAKE 1 CAPSULE BY MOUTH EVERY 7 DAYS. MUST BE SEEN FOR ANY FURTHER REFILLS 02/28/19   Courtney Spencer, FNP    Physical Exam: Vitals:   06/24/2020 1522 07/09/2020 1600 06/18/2020 1611 06/22/2020 1630  BP:  (!) 77/60  115/74  Pulse:  88  84  Resp:  (!) 51  (!) 35  Temp:      TempSrc:      SpO2: (!) 15% 94% (!) 88% (!) 84%  Weight:      Height:        Constitutional: Acutely ill-appearing Vitals:   06/29/2020 1522 06/17/2020 1600 06/27/2020 1611 06/15/2020 1630  BP:  (!) 77/60  115/74  Pulse:  88  84  Resp:  (!) 51  (!) 35  Temp:      TempSrc:      SpO2: (!)  15% 94% (!) 88% (!) 84%  Weight:      Height:       Eyes: PERRL, lids and conjunctivae normal ENMT: Mucous membranes are moist. Neck: normal, supple, no masses, no thyromegaly Respiratory: Tachypneic, moderate respiratory distress, increased work of breathing,  cardiovascular: Regular rate and rhythm, No extremity edema. 2+ pedal pulses. No carotid bruits.  Abdomen: Obese, no tenderness, no masses palpated. No hepatosplenomegaly. Bowel sounds positive.  Musculoskeletal: no clubbing / cyanosis. No joint deformity upper and lower extremities. Good ROM, no contractures. Normal muscle tone.  Skin: no rashes, lesions, ulcers. No induration Neurologic: No apparent cranial abnormalities, moving extremities spontaneously Psychiatric: Normal judgment and insight. Alert and oriented x 3. Normal mood.   Labs on Admission: I have personally reviewed following labs and imaging studies  CBC: Recent Labs  Lab 06/10/2020 1515  WBC 8.3  NEUTROABS 6.8  HGB 13.6  HCT 43.6  MCV 94.4  PLT 339   Basic Metabolic Panel: Recent Labs  Lab 06/21/2020 1515  NA 142  K 3.9  CL 109  CO2 20*  GLUCOSE 102*  BUN 49*  CREATININE 1.70*  CALCIUM 8.5*   GFR: Estimated Creatinine Clearance: 46.9 mL/min (A) (by C-G formula based on SCr of 1.7 mg/dL (H)). Liver Function Tests: Recent Labs  Lab 06/26/2020 1515  AST 69*  ALT 42  ALKPHOS 98  BILITOT 0.7  PROT 7.9  ALBUMIN 3.2*    Radiological Exams on Admission: DG Chest Portable 1 View  Result Date: 06/21/2020 CLINICAL DATA:  Hypoxia.  Lethargy. EXAM: PORTABLE CHEST 1 VIEW COMPARISON:  None. FINDINGS: Diffuse bilateral pulmonary infiltrates, right greater than left. No pneumothorax. Possible cardiomegaly. The hila and mediastinum are unremarkable. IMPRESSION: Diffuse bilateral pulmonary infiltrates, right greater than left, worrisome for multifocal pneumonia. Electronically Signed   By: Gerome Sam III M.D   On: 06/19/2020 15:55    EKG: Independently  reviewed.  Sinus Rhythm.  QTc 429.  No significant changes compared to prior.  Assessment/Plan Principal Problem:   Pneumonia due to COVID-19 virus Active Problems:   Acute respiratory failure with hypoxia (HCC)   Hypothyroidism   Morbid obesity (HCC)   AKI (acute kidney injury) (HCC)   Pneumonia due to COVID-19 virus with acute hypoxic respiratory failure-O2 sats down to 39%. O2 sats currently 91% on 15 L nonrebreather and 6 L nasal cannula. Symptoms started 12/31. Diffuse pulmonary bilateral infiltrates right greater than  left, worrisome for multifocal pneumonia. Patient is on unvaccinated. -Obtain a trend inflammatory panel -Remdesivir per pharmacy -Dexamethasone 10 mg x 1 continue 6 mg daily. -COVID-19 admission protocol - Mucolytics, flutter valve, albuterol inhaler as needed - CMP, CBC a.m - Addendum- Switched to heated high flow, goal sats >/+ 88%, will allow for permissive hypoxia, menatl Status intact.   Acute kidney injury-1.7, baseline 0.7-0.8. Likely prerenal  2/2 multiple episodes of diarrhea 2/2 Covid. -1 L bolus given, continue N/s 100cc/hr x  1 day - CMP a,m  Hypothyroidism -TSH ordered in ED -Resume Synthroid  Morbid obesity-BMI 43.  DVT prophylaxis: Heparin Code Status: Full code, she would like to be intubated if her breathing worsens. Family Communication: Spoke to patient's daughter on the phone- Marchelle Folks, plan of care explained, all questions answered. Disposition Plan: > 2 days Consults called: none Admission status: Inpatient, stepdown I certify that at the point of admission it is my clinical judgment that the patient will require inpatient hospital care spanning beyond 2 midnights from the point of admission due to high intensity of service, high risk for further deterioration and high frequency of surveillance required. The following factors support the patient status of inpatient:    Onnie Boer MD [facility]  2020/06/26, 5:38 PM

## 2020-06-16 NOTE — ED Notes (Signed)
Hospital bed or bariatric bed requested for pt comfort- [facility] aware.

## 2020-06-16 NOTE — ED Triage Notes (Addendum)
Pt brought in by EMS . Pt was found in bed by family with cyanotic lips. Reported that she has been sick since 12/31. Pt had not been checked for covid. Pt has had N/V/D Pt family stated she had felt better the last 2 days . Sats 39%, started on 15 L/NRB and sats came up to low 80 s. Pt has not been vaccinated

## 2020-06-16 NOTE — ED Notes (Signed)
Pt given extra pillow and encouraged to lay on side as recommended by RT

## 2020-06-16 NOTE — ED Provider Notes (Addendum)
[facility] EMERGENCY DEPARTMENT Provider Note   CSN: [id number] Arrival date & time: 07-11-20  1506     History Chief Complaint  Patient presents with  . Respiratory Distress    Courtney Simmons is a 65 y.o. female.  Patient brought in by EMS respiratory distress.  Low oxygen saturations.  Patient brought 100% nonrebreather.  Patient states that she has been sick since New Year's Eve.  No formal Covid test.  Was seen by primary care doctor for persistent watery diarrhea on January 6.  No mention of any respiratory problems at that time.  Patient's past medical history significant for morbid obesity hyperlipidemia hypothyroidism.  Patient has not received any of the Covid vaccinations.  Not clear when patient ran into breathing problems.  But certainly present today.  Patient will answer yes or no questions.  Did states she was in the hospital.        Past Medical History:  Diagnosis Date  . Hyperlipidemia   . Hypothyroidism   . Kidney stone   . Morbid obesity (HCC) 01/02/2012  . Obesity     Patient Active Problem List   Diagnosis Date Noted  . Hyperlipidemia 12/25/2015  . Vitamin D deficiency 12/25/2015  . Trimalleolar fracture of left ankle 01/02/2012  . Hypothyroidism 01/02/2012  . Morbid obesity (HCC) 01/02/2012    Past Surgical History:  Procedure Laterality Date  . BACK SURGERY  1992   no prolems with surgery  . BREAST BIOPSY    . cyst removed from throat  1980s  . ORIF ANKLE FRACTURE  01/02/2012   Procedure: OPEN REDUCTION INTERNAL FIXATION (ORIF) ANKLE FRACTURE;  Surgeon: Darreld Mclean, MD;  Location: [facility] ORS;  Service: Orthopedics;  Laterality: Left;     OB History   No obstetric history on file.     Family History  Problem Relation Age of Onset  . Heart attack Mother 54       died 60  . Hypertension Mother   . Diabetes Mother   . Gout Father   . Coronary artery disease Father   . Diabetes Sister   . Stroke Sister   . Coronary artery disease  Sister        died age 56  . Obesity Sister   . Cancer Brother        died age 57  . Stroke Brother 78  . Colon cancer Neg Hx   . Esophageal cancer Neg Hx   . Rectal cancer Neg Hx   . Stomach cancer Neg Hx     Social History   Tobacco Use  . Smoking status: Never Smoker  . Smokeless tobacco: Never Used  Vaping Use  . Vaping Use: Never used  Substance Use Topics  . Alcohol use: No  . Drug use: No    Home Medications Prior to Admission medications   Medication Sig Start Date End Date Taking? Authorizing Provider  acetaminophen (TYLENOL) 325 MG tablet Take 650 mg by mouth every 6 (six) hours as needed.    [provider]  ibuprofen (ADVIL) 200 MG tablet Take 200 mg by mouth every 6 (six) hours as needed.    [provider]  levothyroxine (SYNTHROID) 50 MCG tablet Take 1 tablet (50 mcg total) by mouth daily. 07/05/19 07/04/20  Junie Spencer, FNP  predniSONE (DELTASONE) 10 MG tablet Take 5 daily for 2 days followed by 4,3,2 and 1 for 2 days each. 02/24/20   Mechele Claude, MD  simvastatin (ZOCOR) 20 MG  tablet TAKE 1 TABLET BY MOUTH EVERYDAY AT BEDTIME (NEEDS TO BE SEEN BEFORE NEXT REFILL) 01/14/19   Junie SpencerHawks, Christy A, FNP  Vitamin D, Ergocalciferol, (DRISDOL) 1.25 MG (50000 UT) CAPS capsule TAKE 1 CAPSULE BY MOUTH EVERY 7 DAYS. MUST BE SEEN FOR ANY FURTHER REFILLS 02/28/19   Junie SpencerHawks, Christy A, FNP    Allergies    Patient has no known allergies.  Review of Systems   Review of Systems  Constitutional: Positive for fatigue. Negative for chills and fever.  HENT: Negative for rhinorrhea and sore throat.   Eyes: Negative for visual disturbance.  Respiratory: Positive for shortness of breath. Negative for cough.   Cardiovascular: Negative for chest pain and leg swelling.  Gastrointestinal: Positive for diarrhea. Negative for abdominal pain, nausea and vomiting.  Genitourinary: Negative for dysuria.  Musculoskeletal: Negative for back pain and neck pain.  Skin:  Negative for rash.  Neurological: Negative for dizziness, light-headedness and headaches.  Hematological: Does not bruise/bleed easily.  Psychiatric/Behavioral: Negative for confusion.    Physical Exam Updated Vital Signs BP (!) 77/60   Pulse 88   Temp 98 F (36.7 C) (Oral)   Resp (!) 51   Ht 1.702 m (5\' 7" )   Wt 127 kg   SpO2 (!) 88%   BMI 43.85 kg/m   Physical Exam Vitals and nursing note reviewed.  Constitutional:      General: She is not in acute distress.    Appearance: Normal appearance. She is well-developed and well-nourished.  HENT:     Head: Normocephalic and atraumatic.     Mouth/Throat:     Mouth: Mucous membranes are dry.  Eyes:     Extraocular Movements: Extraocular movements intact.     Conjunctiva/sclera: Conjunctivae normal.     Pupils: Pupils are equal, round, and reactive to light.  Cardiovascular:     Rate and Rhythm: Normal rate and regular rhythm.     Heart sounds: No murmur heard.   Pulmonary:     Effort: Respiratory distress present.     Breath sounds: Normal breath sounds.  Abdominal:     Palpations: Abdomen is soft.     Tenderness: There is no abdominal tenderness.  Musculoskeletal:        General: No swelling or edema.     Cervical back: Normal range of motion and neck supple.  Skin:    General: Skin is warm and dry.     Capillary Refill: Capillary refill takes less than 2 seconds.  Neurological:     General: No focal deficit present.     Mental Status: She is alert and oriented to person, place, and time.     Cranial Nerves: No cranial nerve deficit.     Sensory: No sensory deficit.     Motor: No weakness.  Psychiatric:        Mood and Affect: Mood and affect normal.     ED Results / Procedures / Treatments   Labs (all labs ordered are listed, but only abnormal results are displayed) Labs Reviewed  RESP PANEL BY RT-PCR (FLU A&B, COVID) ARPGX2 - Abnormal; Notable for the following components:      Result Value   SARS  Coronavirus 2 by RT PCR POSITIVE (*)    All other components within normal limits  CBC WITH DIFFERENTIAL/PLATELET - Abnormal; Notable for the following components:   Abs Immature Granulocytes 0.15 (*)    All other components within normal limits  COMPREHENSIVE METABOLIC PANEL - Abnormal; Notable for the following  components:   CO2 20 (*)    Glucose, Bld 102 (*)    BUN 49 (*)    Creatinine, Ser 1.70 (*)    Calcium 8.5 (*)    Albumin 3.2 (*)    AST 69 (*)    GFR, Estimated 33 (*)    All other components within normal limits  CULTURE, BLOOD (ROUTINE X 2)  CULTURE, BLOOD (ROUTINE X 2)  LACTIC ACID, PLASMA  LACTIC ACID, PLASMA  D-DIMER, QUANTITATIVE (NOT AT [facility])  PROCALCITONIN  LACTATE DEHYDROGENASE  FERRITIN  TRIGLYCERIDES  FIBRINOGEN  C-REACTIVE PROTEIN  TSH    EKG EKG Interpretation  Date/Time:  Saturday June 16 2020 15:14:20 EST Ventricular Rate:  87 PR Interval:    QRS Duration: 91 QT Interval:  356 QTC Calculation: 429 R Axis:   159 Text Interpretation: Right and left arm electrode reversal, interpretation assumes no reversal Sinus or ectopic atrial rhythm Probable lateral infarct, age indeterminate No previous ECGs available Confirmed by Vanetta Mulders [phone]) on 06/10/2020 3:31:02 PM   Radiology DG Chest Portable 1 View  Result Date: 06/15/2020 CLINICAL DATA:  Hypoxia.  Lethargy. EXAM: PORTABLE CHEST 1 VIEW COMPARISON:  None. FINDINGS: Diffuse bilateral pulmonary infiltrates, right greater than left. No pneumothorax. Possible cardiomegaly. The hila and mediastinum are unremarkable. IMPRESSION: Diffuse bilateral pulmonary infiltrates, right greater than left, worrisome for multifocal pneumonia. Electronically Signed   By: Gerome Sam III M.D   On: 06/10/2020 15:55    Procedures Procedures (including critical care time)  CRITICAL CARE Performed by: Vanetta Mulders Total critical care time: 45 minutes Critical care time was exclusive of separately billable  procedures and treating other patients. Critical care was necessary to treat or prevent imminent or life-threatening deterioration. Critical care was time spent personally by me on the following activities: development of treatment plan with patient and/or surrogate as well as nursing, discussions with consultants, evaluation of patient's response to treatment, examination of patient, obtaining history from patient or surrogate, ordering and performing treatments and interventions, ordering and review of laboratory studies, ordering and review of radiographic studies, pulse oximetry and re-evaluation of patient's condition.   Medications Ordered in ED Medications  0.9 %  sodium chloride infusion (1,000 mLs Intravenous New Bag/Given 06/15/2020 1618)  remdesivir 100 mg in sodium chloride 0.9 % 100 mL IVPB (has no administration in time range)    Followed by  remdesivir 100 mg in sodium chloride 0.9 % 100 mL IVPB (has no administration in time range)  dexamethasone (DECADRON) injection 6 mg (6 mg Intravenous Given 07/08/2020 1618)    ED Course  I have reviewed the triage vital signs and the nursing notes.  Pertinent labs & imaging results that were available during my care of the patient were reviewed by me and considered in my medical decision making (see chart for details).    MDM Rules/Calculators/A&P                            Patient's presentation chest x-ray highly suggestive of of COVID-19 pneumonia infection.  Multifocal pneumonia no leukocytosis.  Patient requiring 15 L of oxygen supplemented but nasal cannula.  Satting in low 90s.  Patient's initial blood pressure high.  Covid lab test ordered.  Note as well as remdesivir and Decadron.  Patient's BUN and creatinine elevated may be consistent with the diarrhea she has had for a number of days.  No abdominal tenderness.  We will contact hospitalist for admission.  Formal Covid testing is pending.  Final Clinical Impression(s) / ED  Diagnoses Final diagnoses:  Respiratory distress  Hypoxia  COVID    Rx / DC Orders ED Discharge Orders    None       Vanetta Mulders, MD 06/12/2020 1608    Vanetta Mulders, MD 06/30/2020 [phone]

## 2020-06-16 NOTE — ED Notes (Signed)
Spoke with pt daughter in law, tracy, updated on pt status and plan of care- informed pt that family member called and was updated.

## 2020-06-16 NOTE — ED Notes (Signed)
RT notified for need of heated high flow O2 as pt is not keeping O2 sats above 85%. Pt is lying on side. Dr Mariea Clonts made aware. Pt remains alert and oriented at this time.

## 2020-06-16 NOTE — ED Notes (Signed)
Pt given water per request- RT at bedside adjusting O2

## 2020-06-16 NOTE — ED Notes (Signed)
Pt O2 sat reading 21% with good pleth. Pt had removed all O2. Pt noted to be cyanotic. O2 reapplied at 6L [place] and 15L NR. O2 sats increased to 88%. Pt instructed to keep O2 on. Pt verbalized understanding.

## 2020-06-17 ENCOUNTER — Inpatient Hospital Stay (HOSPITAL_COMMUNITY): Payer: BC Managed Care – PPO

## 2020-06-17 DIAGNOSIS — J9601 Acute respiratory failure with hypoxia: Secondary | ICD-10-CM | POA: Diagnosis not present

## 2020-06-17 DIAGNOSIS — U071 COVID-19: Secondary | ICD-10-CM | POA: Diagnosis not present

## 2020-06-17 DIAGNOSIS — J1282 Pneumonia due to coronavirus disease 2019: Secondary | ICD-10-CM | POA: Diagnosis not present

## 2020-06-17 LAB — HIV ANTIBODY (ROUTINE TESTING W REFLEX): HIV Screen 4th Generation wRfx: NONREACTIVE

## 2020-06-17 LAB — MRSA PCR SCREENING: MRSA by PCR: NEGATIVE

## 2020-06-17 LAB — BLOOD GAS, ARTERIAL
Acid-base deficit: 4.6 mmol/L — ABNORMAL HIGH (ref 0.0–2.0)
Acid-base deficit: 5.3 mmol/L — ABNORMAL HIGH (ref 0.0–2.0)
Acid-base deficit: 7.1 mmol/L — ABNORMAL HIGH (ref 0.0–2.0)
Bicarbonate: 20.1 mmol/L (ref 20.0–28.0)
Bicarbonate: 19.3 mmol/L — ABNORMAL LOW (ref 20.0–28.0)
Bicarbonate: 19.4 mmol/L — ABNORMAL LOW (ref 20.0–28.0)
FIO2: 100
FIO2: 100
FIO2: 100
O2 Saturation: 87.9 %
O2 Saturation: 88.8 %
O2 Saturation: 97.1 %
Patient temperature: 37.8
Patient temperature: 37.4
Patient temperature: 37.3
pCO2 arterial: 46.1 mmHg (ref 32.0–48.0)
pCO2 arterial: 50.5 mmHg — ABNORMAL HIGH (ref 32.0–48.0)
pCO2 arterial: 51.3 mmHg — ABNORMAL HIGH (ref 32.0–48.0)
pH, Arterial: 7.283 — ABNORMAL LOW (ref 7.350–7.450)
pH, Arterial: 7.241 — ABNORMAL LOW (ref 7.350–7.450)
pH, Arterial: 7.206 — ABNORMAL LOW (ref 7.350–7.450)
pO2, Arterial: 66.4 mmHg — ABNORMAL LOW (ref 83.0–108.0)
pO2, Arterial: 70 mmHg — ABNORMAL LOW (ref 83.0–108.0)
pO2, Arterial: 116 mmHg — ABNORMAL HIGH (ref 83.0–108.0)

## 2020-06-17 LAB — CBC WITH DIFFERENTIAL/PLATELET
Abs Immature Granulocytes: 0.21 10*3/uL — ABNORMAL HIGH (ref 0.00–0.07)
Basophils Absolute: 0 10*3/uL (ref 0.0–0.1)
Basophils Relative: 0 %
Eosinophils Absolute: 0 10*3/uL (ref 0.0–0.5)
Eosinophils Relative: 0 %
HCT: 41.8 % (ref 36.0–46.0)
Hemoglobin: 13 g/dL (ref 12.0–15.0)
Immature Granulocytes: 2 %
Lymphocytes Relative: 10 %
Lymphs Abs: 1.1 10*3/uL (ref 0.7–4.0)
MCH: 29.4 pg (ref 26.0–34.0)
MCHC: 31.1 g/dL (ref 30.0–36.0)
MCV: 94.6 fL (ref 80.0–100.0)
Monocytes Absolute: 0.3 10*3/uL (ref 0.1–1.0)
Monocytes Relative: 3 %
Neutro Abs: 8.9 10*3/uL — ABNORMAL HIGH (ref 1.7–7.7)
Neutrophils Relative %: 85 %
Platelets: 331 10*3/uL (ref 150–400)
RBC: 4.42 MIL/uL (ref 3.87–5.11)
RDW: 14.4 % (ref 11.5–15.5)
WBC: 10.5 10*3/uL (ref 4.0–10.5)
nRBC: 0.2 % (ref 0.0–0.2)

## 2020-06-17 LAB — COMPREHENSIVE METABOLIC PANEL
ALT: 34 U/L (ref 0–44)
AST: 58 U/L — ABNORMAL HIGH (ref 15–41)
Albumin: 2.9 g/dL — ABNORMAL LOW (ref 3.5–5.0)
Alkaline Phosphatase: 94 U/L (ref 38–126)
Anion gap: 10 (ref 5–15)
BUN: 31 mg/dL — ABNORMAL HIGH (ref 8–23)
CO2: 19 mmol/L — ABNORMAL LOW (ref 22–32)
Calcium: 8.3 mg/dL — ABNORMAL LOW (ref 8.9–10.3)
Chloride: 116 mmol/L — ABNORMAL HIGH (ref 98–111)
Creatinine, Ser: 0.84 mg/dL (ref 0.44–1.00)
GFR, Estimated: >60 mL/min (ref >60)
Glucose, Bld: 124 mg/dL — ABNORMAL HIGH (ref 70–99)
Potassium: 4.2 mmol/L (ref 3.5–5.1)
Sodium: 145 mmol/L (ref 135–145)
Total Bilirubin: 0.7 mg/dL (ref 0.3–1.2)
Total Protein: 7.3 g/dL (ref 6.5–8.1)

## 2020-06-17 LAB — TRIGLYCERIDES: Triglycerides: 238 mg/dL — ABNORMAL HIGH (ref <150)

## 2020-06-17 LAB — PHOSPHORUS: Phosphorus: 2 mg/dL — ABNORMAL LOW (ref 2.5–4.6)

## 2020-06-17 LAB — MAGNESIUM: Magnesium: 2.2 mg/dL (ref 1.7–2.4)

## 2020-06-17 LAB — FERRITIN: Ferritin: 938 ng/mL — ABNORMAL HIGH (ref 11–307)

## 2020-06-17 LAB — GLUCOSE, CAPILLARY
Glucose-Capillary: 133 mg/dL — ABNORMAL HIGH (ref 70–99)
Glucose-Capillary: 134 mg/dL — ABNORMAL HIGH (ref 70–99)

## 2020-06-17 LAB — D-DIMER, QUANTITATIVE: D-Dimer, Quant: 3.49 ug/mL-FEU — ABNORMAL HIGH (ref 0.00–0.50)

## 2020-06-17 LAB — C-REACTIVE PROTEIN: CRP: 24.2 mg/dL — ABNORMAL HIGH (ref <1.0)

## 2020-06-17 MED ORDER — SODIUM CHLORIDE 0.9 % IV BOLUS
1000.0000 mL | Freq: Once | INTRAVENOUS | Status: AC
  Administered 2020-06-17: 1000 mL via INTRAVENOUS

## 2020-06-17 MED ORDER — INSULIN ASPART 100 UNIT/ML ~~LOC~~ SOLN: 0.0000 [IU] | SUBCUTANEOUS | Status: DC

## 2020-06-17 MED ORDER — SODIUM CHLORIDE 0.9% FLUSH: 10.0000 mL | INTRAVENOUS | Status: DC | PRN

## 2020-06-17 MED ORDER — PANTOPRAZOLE SODIUM 40 MG IV SOLR
40.0000 mg | INTRAVENOUS | Status: DC
  Administered 2020-06-17 – 2020-06-20 (×4): 40 mg via INTRAVENOUS
  Filled 2020-06-17 (×4): qty 40

## 2020-06-17 MED ORDER — CHLORHEXIDINE GLUCONATE 0.12% ORAL RINSE (MEDLINE KIT)
15.0000 mL | Freq: Two times a day (BID) | OROMUCOSAL | Status: DC
  Administered 2020-06-17 – 2020-06-28 (×22): 15 mL via OROMUCOSAL

## 2020-06-17 MED ORDER — MIDAZOLAM BOLUS VIA INFUSION
1.0000 mg | INTRAVENOUS | Status: DC | PRN
Start: 2020-06-17 — End: 2020-06-17
  Filled 2020-06-17: qty 2

## 2020-06-17 MED ORDER — MIDAZOLAM HCL 2 MG/2ML IJ SOLN: 2.0000 mg | INTRAMUSCULAR | Status: DC | PRN

## 2020-06-17 MED ORDER — MIDAZOLAM HCL 2 MG/2ML IJ SOLN
2.0000 mg | INTRAMUSCULAR | Status: DC | PRN
Start: 2020-06-17 — End: 2020-06-17
  Administered 2020-06-17: 2 mg via INTRAVENOUS
  Filled 2020-06-17 (×2): qty 2

## 2020-06-17 MED ORDER — SODIUM CHLORIDE 0.9 % IV SOLN: Freq: Once | INTRAVENOUS | Status: AC

## 2020-06-17 MED ORDER — DEXAMETHASONE SODIUM PHOSPHATE 10 MG/ML IJ SOLN
6.0000 mg | INTRAMUSCULAR | Status: DC
  Administered 2020-06-18: 6 mg via INTRAVENOUS
  Filled 2020-06-17: qty 1

## 2020-06-17 MED ORDER — FENTANYL CITRATE (PF) 100 MCG/2ML IJ SOLN
50.0000 ug | INTRAMUSCULAR | Status: DC | PRN
  Administered 2020-06-17 (×2): 200 ug via INTRAVENOUS
  Filled 2020-06-17 (×2): qty 4

## 2020-06-17 MED ORDER — PROPOFOL 1000 MG/100ML IV EMUL
0.0000 ug/kg/min | INTRAVENOUS | Status: DC
  Administered 2020-06-17: 5 ug/kg/min via INTRAVENOUS

## 2020-06-17 MED ORDER — FENTANYL CITRATE (PF) 100 MCG/2ML IJ SOLN
50.0000 ug | INTRAMUSCULAR | Status: AC | PRN
  Administered 2020-06-18 – 2020-06-23 (×3): 50 ug via INTRAVENOUS

## 2020-06-17 MED ORDER — SODIUM CHLORIDE 0.9 % IV SOLN
1000.0000 mL | INTRAVENOUS | Status: AC
  Administered 2020-06-17: 1000 mL via INTRAVENOUS

## 2020-06-17 MED ORDER — MIDAZOLAM 50MG/50ML (1MG/ML) PREMIX INFUSION
0.5000 mg/h | INTRAVENOUS | Status: DC
  Administered 2020-06-17: 0.5 mg/h via INTRAVENOUS
  Administered 2020-06-19: 5 mg/h via INTRAVENOUS
  Administered 2020-06-19: 8 mg/h via INTRAVENOUS
  Administered 2020-06-19: 9 mg/h via INTRAVENOUS
  Administered 2020-06-19: 10 mg/h via INTRAVENOUS
  Filled 2020-06-17 (×5): qty 50

## 2020-06-17 MED ORDER — CHLORHEXIDINE GLUCONATE CLOTH 2 % EX PADS
6.0000 | MEDICATED_PAD | Freq: Every day | CUTANEOUS | Status: DC
  Administered 2020-06-17 – 2020-06-28 (×12): 6 via TOPICAL

## 2020-06-17 MED ORDER — FENTANYL BOLUS VIA INFUSION
50.0000 ug | INTRAVENOUS | Status: DC | PRN
  Administered 2020-06-18 – 2020-06-19 (×3): 50 ug via INTRAVENOUS
  Filled 2020-06-17: qty 50

## 2020-06-17 MED ORDER — MIDAZOLAM HCL 2 MG/2ML IJ SOLN
2.0000 mg | INTRAMUSCULAR | Status: DC | PRN
  Administered 2020-06-17: 2 mg via INTRAVENOUS
  Filled 2020-06-17 (×2): qty 2

## 2020-06-17 MED ORDER — MIDAZOLAM HCL 2 MG/2ML IJ SOLN
2.0000 mg | INTRAMUSCULAR | Status: AC | PRN
  Administered 2020-06-17 (×3): 2 mg via INTRAVENOUS
  Filled 2020-06-17 (×2): qty 2

## 2020-06-17 MED ORDER — SIMVASTATIN 20 MG PO TABS: 20.0000 mg | ORAL_TABLET | Freq: Every day | ORAL | Status: DC

## 2020-06-17 MED ORDER — SODIUM CHLORIDE 0.9% FLUSH
10.0000 mL | Freq: Two times a day (BID) | INTRAVENOUS | Status: DC
  Administered 2020-06-17 – 2020-06-28 (×20): 10 mL

## 2020-06-17 MED ORDER — POLYETHYLENE GLYCOL 3350 17 G PO PACK: 17.0000 g | PACK | Freq: Every day | ORAL | Status: DC

## 2020-06-17 MED ORDER — PROPOFOL 1000 MG/100ML IV EMUL
INTRAVENOUS | Status: AC
  Filled 2020-06-17: qty 100

## 2020-06-17 MED ORDER — ACETAMINOPHEN 10 MG/ML IV SOLN
1000.0000 mg | Freq: Once | INTRAVENOUS | Status: AC
  Administered 2020-06-17: 1000 mg via INTRAVENOUS
  Filled 2020-06-17: qty 100

## 2020-06-17 MED ORDER — DOCUSATE SODIUM 50 MG/5ML PO LIQD: 100.0000 mg | Freq: Two times a day (BID) | ORAL | Status: DC

## 2020-06-17 MED ORDER — MIDAZOLAM 50MG/50ML (1MG/ML) PREMIX INFUSION
0.0000 mg/h | INTRAVENOUS | Status: DC
  Administered 2020-06-17: 2 mg/h via INTRAVENOUS
  Filled 2020-06-17: qty 50

## 2020-06-17 MED ORDER — ETOMIDATE 2 MG/ML IV SOLN
20.0000 mg | Freq: Once | INTRAVENOUS | Status: AC
  Administered 2020-06-17: 20 mg via INTRAVENOUS

## 2020-06-17 MED ORDER — FENTANYL CITRATE (PF) 100 MCG/2ML IJ SOLN: 50.0000 ug | Freq: Once | INTRAMUSCULAR | Status: DC

## 2020-06-17 MED ORDER — DOCUSATE SODIUM 50 MG/5ML PO LIQD
100.0000 mg | Freq: Two times a day (BID) | ORAL | Status: DC
  Administered 2020-06-17 – 2020-06-28 (×21): 100 mg
  Filled 2020-06-17 (×21): qty 10

## 2020-06-17 MED ORDER — ORAL CARE MOUTH RINSE
15.0000 mL | OROMUCOSAL | Status: DC
  Administered 2020-06-17 – 2020-06-28 (×110): 15 mL via OROMUCOSAL

## 2020-06-17 MED ORDER — SODIUM CHLORIDE 0.9 % IV SOLN: INTRAVENOUS | Status: DC | PRN

## 2020-06-17 MED ORDER — MIDAZOLAM HCL 2 MG/2ML IJ SOLN
2.0000 mg | INTRAMUSCULAR | Status: DC | PRN
  Administered 2020-06-18: 2 mg via INTRAVENOUS
  Filled 2020-06-17: qty 2

## 2020-06-17 MED ORDER — TOCILIZUMAB 400 MG/20ML IV SOLN
800.0000 mg | Freq: Once | INTRAVENOUS | Status: AC
  Administered 2020-06-17: 800 mg via INTRAVENOUS
  Filled 2020-06-17: qty 40

## 2020-06-17 MED ORDER — FENTANYL 2500MCG IN NS 250ML (10MCG/ML) PREMIX INFUSION
50.0000 ug/h | INTRAVENOUS | Status: DC
  Administered 2020-06-17: 50 ug/h via INTRAVENOUS
  Administered 2020-06-18: 90 ug/h via INTRAVENOUS
  Administered 2020-06-19: 150 ug/h via INTRAVENOUS
  Filled 2020-06-17 (×3): qty 250

## 2020-06-17 MED ORDER — METHYLPREDNISOLONE SODIUM SUCC 125 MG IJ SOLR
80.0000 mg | Freq: Two times a day (BID) | INTRAMUSCULAR | Status: DC
  Administered 2020-06-17 (×2): 80 mg via INTRAVENOUS
  Filled 2020-06-17 (×2): qty 2

## 2020-06-17 MED ORDER — SUCCINYLCHOLINE CHLORIDE 20 MG/ML IJ SOLN
100.0000 mg | Freq: Once | INTRAMUSCULAR | Status: AC
  Administered 2020-06-17: 100 mg via INTRAVENOUS

## 2020-06-17 MED ORDER — MIDAZOLAM HCL 2 MG/2ML IJ SOLN
2.0000 mg | INTRAMUSCULAR | Status: AC | PRN
  Administered 2020-06-17 – 2020-06-18 (×3): 2 mg via INTRAVENOUS
  Filled 2020-06-17 (×2): qty 2

## 2020-06-17 MED ORDER — POLYETHYLENE GLYCOL 3350 17 G PO PACK
17.0000 g | PACK | Freq: Every day | ORAL | Status: DC
  Administered 2020-06-17 – 2020-06-25 (×9): 17 g
  Filled 2020-06-17 (×10): qty 1

## 2020-06-17 NOTE — Progress Notes (Signed)
TRH NIGHT SHIFT.  06/17/2020  0253 The patient was unable to tolerate prone positioning.  Her oxygen requirement has been escalated to 60 LPM via HF [place] and 100% oxygen through NRB.   0555  The patient has had progressively worse hypoxia and increased oxygen requirements through the shift despite being on HFNC and NRB mask oxygen.  RT will try a closed circuit BiPAP ventilation the last resort before proceeding to a possible endotracheal intubation with mechanical ventilation.  4401 The patient seems to be doing a lot better better with close circuit BiPAP and currently has an O2 saturation of 96% on an FiO2 of 100%.  Sanda Klein, MD

## 2020-06-17 NOTE — H&P (Signed)
NAME:  Courtney Simmons, MRN:  [id number], DOB:  1956/05/06, LOS: 1 ADMISSION DATE:  06/14/2020, CONSULTATION DATE: 06/18/19 REFERRING MD:  Vanetta Mulders, CHIEF COMPLAINT:  Shortness of breath/respiratory failure.   Brief History:  65 yo woman with obesity hypothyroism, COVID PNA   History of Present Illness:  Courtney Simmons is a 65 year old woman Morbid obesity, hypothyroidism, found by family in bed with cyanotic lips.   Courtney Simmons had been sick with N/V/D since 12/31, had been feeling better prior 2 days.  Not vaccinated against coronavirus.  Very hypoxic initially 39% on room air, brought to [facility] with escalating oxygen needs, intubated this afternoon.    Hypotensive temporarily.   Remains on propofol.   Central line placed.    Phone visit with primary care on 1/6 : fever and diarrhea since 12/31.  .  Fever intermittent x 3 weeks.  Non bloody, watery.    Upon arrival to [facility], was very profoundly hypoxic with any movement or turning, recovered with sedation.  Was also hypoxemic with movement to gurney for transport.  Also had been transiently hypotensive with movement.   Received about 2L fluid on my review of chart. Foley in place with some slightly dark urine.    Past Medical History:  Hypothyroidism HLD Nephrolithiasis obesity  Hx of back surgery, breast biopsy, ankle surgery  Home meds: prednisone taper 9/21, vitamin D, zocor, levoxyl, prn advil and tylenol Significant Hospital Events:  Intubated 1/9  Consults:    Procedures:  Intubated 1/9 Central line R femoral 1/9  Significant Diagnostic Tests:  CXR   Micro Data:    Antimicrobials:  remdesivir  Interim History / Subjective:    Objective   Blood pressure 96/62, pulse 70, temperature 99.1 F (37.3 C), resp. rate (!) 21, height 5\' 7"  (1.702 m), weight 127 kg, SpO2 90 %.    Vent Mode: PRVC FiO2 (%):  [100 %] 100 % Set Rate:  [15 bmp-24 bmp] 24 bmp Vt Set:  [500 mL] 500 mL PEEP:  [10 cmH20] 10  cmH20 Plateau Pressure:  [36 cmH20] 36 cmH20   Intake/Output Summary (Last 24 hours) at 06/17/2020 1941 Last data filed at 06/17/2020 1737 Gross per 24 hour  Intake 1149.7 ml  Output --  Net 1149.7 ml   Filed Weights   06/27/2020 1516  Weight: 127 kg    Examination: General: sedated, intubated  HENT: NCAT Lungs: diminished breath sounds anteriorly B Cardiovascular: RRR no mgr  Abdomen: obese, no obvious tenderness, nbs Extremities:  No edema or erythema Neuro: sedated perrl  Resolved Hospital Problem list     Assessment & Plan:  COVID 19 PNA:  Severe hypoxemic hypercarbic respiratory failure.   Intubated.  remdesevir started, decadron given (switched to methylpred for some reason at [facility]), given tocilizumab as well.  ARDS Goal PH 7.3-7.45 Peak pressure goal <30, driving pressure 12-29-1987.  Goal PaO2 55-80.  Monitor closely.  Consider proning, but holding off for now given hemodynamic instability with turning, severe hypoxemia with movement.  Sedation as needed.    Hx hypothyroidism: cont home levoxyl  HLD: cont statin   Best practice (evaluated daily)  Diet:NPO, start tube feeds in am.  Pain/Anxiety/Delirium protocol (if indicated): fentanyl versed  VAP protocol (if indicated): yes DVT prophylaxis: heparin TID GI prophylaxis: protonix Glucose control: iss prn  Mobility:  Disposition:ICU  Goals of Care:  Last date of multidisciplinary goals of care discussion: Family and staff present:  Summary of discussion:  Follow up goals of  care discussion due:  Code Status: Full Code   Labs   CBC: Recent Labs  Lab 06/12/2020 1515 06/17/20 0556  WBC 8.3 10.5  NEUTROABS 6.8 8.9*  HGB 13.6 13.0  HCT 43.6 41.8  MCV 94.4 94.6  PLT 339 331    Basic Metabolic Panel: Recent Labs  Lab 06/30/2020 1515 06/17/20 0556  NA 142 145  K 3.9 4.2  CL 109 116*  CO2 20* 19*  GLUCOSE 102* 124*  BUN 49* 31*  CREATININE 1.70* 0.84  CALCIUM 8.5* 8.3*  MG  --  2.2  PHOS  --  2.0*    GFR: Estimated Creatinine Clearance: 95 mL/min (by C-G formula based on SCr of 0.84 mg/dL). Recent Labs  Lab 06/19/2020 1515 07/09/2020 1702 06/17/20 0556  PROCALCITON  --  0.69  --   WBC 8.3  --  10.5    Liver Function Tests: Recent Labs  Lab 06/26/2020 1515 06/17/20 0556  AST 69* 58*  ALT 42 34  ALKPHOS 98 94  BILITOT 0.7 0.7  PROT 7.9 7.3  ALBUMIN 3.2* 2.9*   No results for input(s): LIPASE, AMYLASE in the last 168 hours. No results for input(s): AMMONIA in the last 168 hours.  ABG    Component Value Date/Time   PHART 7.241 (L) 06/17/2020 1702   PCO2ART 50.5 (H) 06/17/2020 1702   PO2ART 70.0 (L) 06/17/2020 1702   HCO3 19.3 (L) 06/17/2020 1702   ACIDBASEDEF 5.3 (H) 06/17/2020 1702   O2SAT 88.8 06/17/2020 1702     Coagulation Profile: No results for input(s): INR, PROTIME in the last 168 hours.  Cardiac Enzymes: No results for input(s): CKTOTAL, CKMB, CKMBINDEX, TROPONINI in the last 168 hours.  HbA1C: No results found for: HGBA1C  CBG: No results for input(s): GLUCAP in the last 168 hours.  Review of Systems:   Unable to assess  Past Medical History:  Courtney Simmons,  has a past medical history of Hyperlipidemia, Hypothyroidism, Kidney stone, Morbid obesity (HCC) (01/02/2012), and Obesity.   Surgical History:   Past Surgical History:  Procedure Laterality Date  . BACK SURGERY  1992   no prolems with surgery  . BREAST BIOPSY    . cyst removed from throat  1980s  . ORIF ANKLE FRACTURE  01/02/2012   Procedure: OPEN REDUCTION INTERNAL FIXATION (ORIF) ANKLE FRACTURE;  Surgeon: Darreld Mclean, MD;  Location: [facility];  Service: Orthopedics;  Laterality: Left;     Social History:   reports that Courtney Simmons has never smoked. Courtney Simmons has never used smokeless tobacco. Courtney Simmons reports that Courtney Simmons does not drink alcohol and does not use drugs.   Family History:  Her family history includes Cancer in her brother; Coronary artery disease in her father and sister; Diabetes in her mother and  sister; Gout in her father; Heart attack (age of onset: 72) in her mother; Hypertension in her mother; Obesity in her sister; Stroke in her sister; Stroke (age of onset: 70) in her brother. There is no history of Colon cancer, Esophageal cancer, Rectal cancer, or Stomach cancer.   Allergies No Known Allergies   Home Medications  Prior to Admission medications   Medication Sig Start Date End Date Taking? Authorizing Provider  acetaminophen (TYLENOL) 325 MG tablet Take 650 mg by mouth every 6 (six) hours as needed.   Yes [provider]  ibuprofen (ADVIL) 200 MG tablet Take 200 mg by mouth every 6 (six) hours as needed.   Yes [provider]  levothyroxine (SYNTHROID) 50 MCG tablet Take  1 tablet (50 mcg total) by mouth daily. 07/05/19 07/04/20 Yes Junie Spencer, FNP     Critical care time:  60 min

## 2020-06-17 NOTE — ED Notes (Signed)
Pt intubated with 7.5 ET tube x 1 attempt without difficulty by Graciella Freer PA. + color change on CO2 detector. 25 at lip

## 2020-06-17 NOTE — ED Provider Notes (Signed)
[facility] here to transport patient to ICU.  Patient with movement oxygen saturations dropped to 75%.  Repeat chest x-ray was done which shows endotracheal tube in correct position.  No evidence of any pneumothorax.  Certainly worsening infiltrates multifocal.   Vanetta Mulders, MD 06/17/20 [phone]

## 2020-06-17 NOTE — ED Provider Notes (Addendum)
Procedure Name: Intubation Date/Time: 06/17/2020 1:53 PM Performed by: Maxwell Caul, PA-C Pre-anesthesia Checklist: Patient identified and Emergency Drugs available Oxygen Delivery Method: Non-rebreather mask Induction Type: Rapid sequence Ventilation: Mask ventilation without difficulty Laryngoscope Size: Glidescope Tube size: 7.5 mm Number of attempts: 1 Airway Equipment and Method: Patient positioned with wedge pillow,  Bougie stylet and Video-laryngoscopy Placement Confirmation: ETT inserted through vocal cords under direct vision,  Positive ETCO2 and CO2 detector Secured at: 24.5 cm Tube secured with: ETT holder Future Recommendations: Recommend- induction with short-acting agent, and alternative techniques readily available Comments: The initial insertion of the tube was successful and had positive color change.  However, the balloon cuff would not insufflate.  The tube was stabilized with a bougie and removed and then a new tube was inserted.  This second tube had positive color change and was stabilized with the ET tube holder    .Central Line  Date/Time: 06/17/2020 1:55 PM Performed by: Maxwell Caul, PA-C Authorized by: Maxwell Caul, PA-C   Consent:    Consent obtained:  Emergent situation Universal protocol:    Patient identity confirmed:  Arm band Pre-procedure details:    Indication(s): central venous access and hemodynamic monitoring     Skin preparation:  Chlorhexidine Procedure details:    Location:  R femoral   Patient position:  Supine   Procedural supplies:  Triple lumen   Catheter size:  7 Fr   Landmarks identified: yes     Ultrasound guidance: no     Number of attempts:  3   Successful placement: yes   Post-procedure details:    Post-procedure:  Dressing applied and line sutured   Assessment:  Blood return through all ports   Procedure completion:  Tolerated    Procedures were done under the direct supervision and assistance of Dr.  Particia Nearing.   Portions of this note were generated with Scientist, clinical (histocompatibility and immunogenetics). Dictation errors may occur despite best attempts at proofreading.     Maxwell Caul, PA-C 06/17/20 1403     Maxwell Caul, PA-C 06/19/20 1009    Jacalyn Lefevre, MD 06/24/20 1455

## 2020-06-17 NOTE — ED Notes (Signed)
Pt c/o nausea.  

## 2020-06-17 NOTE — ED Notes (Signed)
Central line in

## 2020-06-17 NOTE — Progress Notes (Signed)
PROGRESS NOTE    Courtney Simmons  YKZ:[id number] DOB: 04/04/1956 DOA: 06/25/2020 PCP: Junie Spencer, FNP   Chief Complaint  Patient presents with  . Respiratory Distress    Brief Narrative:  As per H&P written by Dr. Mariea Clonts on 06/17/2019 Courtney Simmons is a 65 y.o. female with medical history significant for morbid obesity, BMI of 43, and hypothyroidism.  Patient presented to the ED via EMS with complaints of increasing difficulty breathing.  She has also had watery diarrhea, and 1 or 2 episodes of vomiting.  Has been sick since 06/08/20-New Year's Eve.  Patient is not vaccinated.  She has not had a Covid test per to arrival in the ED today. Denies chest pain.   Patient had a televisit with her primary care provider- 1/6-at that time her complaints was diarrhea, intermittent fevers-notes fevers have been intermittent for 3 weeks.  Highest temperature 99.8.  ED Course: O2 sats 39% and was placed on 15 L nonrebreather.  Covid test positive.  Creatinine elevated 1.7, baseline 0.7-0.8.  Serum bicarb - 20.  Chest x-ray shows diffuse bilateral pulmonary infiltrates right greater than left, worrisome for multifocal pneumonia.  Dexamethasone 6 mg given, remdesivir started..  1 L bolus given.  Hospitalist to admit for Covid pneumonia.  Assessment & Plan: 1-acute respiratory failure with hypoxia in the setting of COVID-19 pneumonia -Patient started on remdesivir and steroids -Inflammatory markers continue trending up, worsening respiratory status appreciated. -She escalated to require close sleep with BiPAP morning to maintain oxygen saturation above 93%. -Repeat ABG after 3 hours on BiPAP demonstrated pH 7.286, PO2 of 66, PCO2 of 44; while FIo2 100%. -Actemra X 1 given; decadron switch to high dose solumedrol -patient's case discussed with PCCM and instructions for intubation and transfer to [facility] ICU received. -patient in agreement. -continue to follow inflammatory markers -continue vit C  and Zinc. -continue PRN bronchodilators  2-Hypothyroidism -continue synthroid   3-Morbid obesity (HCC) -Body mass index is 43.85 kg/m. -low calorie diet and portion controlled discussed with patient  4-AKI (acute kidney injury) (HCC) -In the setting of dehydration/prerenal azotemia -Continue minimizing nephrotoxic agents; continue gentle fluid resuscitation. -Follow renal function trend. -Hypotension.   DVT prophylaxis: Heparin Code Status: Full code Family Communication: No family at bedside. Disposition:   Status is: Inpatient  Dispo: The patient is from: home               Anticipated d/c is to: to be determine               Anticipated d/c date is: to be determine               Patient currently no medically stable for discharge; patient has continue deteriorated chart respiratory standpoint after discussing with PCCM plan of care tracheal intubation mechanical ventilation; patient will be transferred to [facility] ICU under critical care service.  Continue remdesivir, Solu-Medrol and gave x1 dose of actemra. Follow clinical response.       Consultants:   PCCM  Procedures:  See below for x-ray report Anticipated intubation and mechanical ventilation 06/17/2020 treated   Antimicrobials/antiviral/immunomodulator: -Remdesivir 2/5 -Actemra 800mg  X1   Subjective: Tachypneic and slightly tachycardic on examination; patient has escalated to require close unit BiPAP in order to maintain oxygen saturation.  Se is using 100% FiO2 supplementation.  Unable to speak in full sentences.  Positive low-grade fever.  Objective: Vitals:   06/17/20 1145 06/17/20 1322 06/17/20 1335 06/17/20 1340  BP:  08/15/20)  169/110 (!) 88/50 (!) 82/41  Pulse: 79 (!) 131 (!) 104 93  Resp: (!) 30 (!) 31 (!) 31 (!) 30  Temp: 100 F (37.8 C) 99.9 F (37.7 C) 99.9 F (37.7 C) 100 F (37.8 C)  TempSrc:      SpO2: 98% (!) 84% (!) 83% (!) 87%  Weight:      Height:        Intake/Output  Summary (Last 24 hours) at 06/17/2020 1416 Last data filed at 06/17/2020 1050 Gross per 24 hour  Intake 2100 ml  Output --  Net 2100 ml   Filed Weights   07/09/2020 1516  Weight: 127 kg    Examination:  General exam: Unable to speak in full sentences; requiring close unit BiPAP to maintain saturation; 100% FiO2 supplementation.  Reports to be short winded complaining of intermittent coughing spells.  Positive low-grade fever, no nausea vomiting. Respiratory system: Positive rhonchi bilaterally; no wheezing.  No crackles.  Positive tachypnea despite the use of BiPAP (respiratory rate 88-40) Cardiovascular system: Sinus tachycardia, no rubs, no gallops, unable to assess JVD secondary to body habitus. Gastrointestinal system: Abdomen is obese, nondistended, soft and nontender. No organomegaly or masses felt. Normal bowel sounds heard. Central nervous system: Alert and oriented. No focal neurological deficits. Extremities: Trace edema bilaterally; no cyanosis or clubbing. Skin: No petechiae. Psychiatry: Judgement and insight appear normal. Mood & affect appropriate.     Data Reviewed: I have personally reviewed following labs and imaging studies  CBC: Recent Labs  Lab 06/13/2020 1515 06/17/20 0556  WBC 8.3 10.5  NEUTROABS 6.8 8.9*  HGB 13.6 13.0  HCT 43.6 41.8  MCV 94.4 94.6  PLT 339 331    Basic Metabolic Panel: Recent Labs  Lab 06/20/2020 1515 06/17/20 0556  NA 142 145  K 3.9 4.2  CL 109 116*  CO2 20* 19*  GLUCOSE 102* 124*  BUN 49* 31*  CREATININE 1.70* 0.84  CALCIUM 8.5* 8.3*  MG  --  2.2  PHOS  --  2.0*    GFR: Estimated Creatinine Clearance: 95 mL/min (by C-G formula based on SCr of 0.84 mg/dL).  Liver Function Tests: Recent Labs  Lab 07/09/2020 1515 06/17/20 0556  AST 69* 58*  ALT 42 34  ALKPHOS 98 94  BILITOT 0.7 0.7  PROT 7.9 7.3  ALBUMIN 3.2* 2.9*    CBG: No results for input(s): GLUCAP in the last 168 hours.  Recent Results (from the past 240  hour(s))  Resp Panel by RT-PCR (Flu A&B, Covid) Nasopharyngeal Swab     Status: Abnormal   Collection Time: 06/29/2020  3:14 PM   Specimen: Nasopharyngeal Swab; Nasopharyngeal(NP) swabs in vial transport medium  Result Value Ref Range Status   SARS Coronavirus 2 by RT PCR POSITIVE (A) NEGATIVE Final    Comment: CRITICAL RESULT CALLED TO, READ BACK BY AND VERIFIED WITH: DOSS,M AT 1627 ON 1.8.21 BY ISLEY,B (NOTE) SARS-CoV-2 target nucleic acids are DETECTED.  The SARS-CoV-2 RNA is generally detectable in upper respiratory specimens during the acute phase of infection. Positive results are indicative of the presence of the identified virus, but do not rule out bacterial infection or co-infection with other pathogens not detected by the test. Clinical correlation with patient history and other diagnostic information is necessary to determine patient infection status. The expected result is Negative.  Fact Sheet for Patients: BloggerCourse.comhttps://www.fda.gov/media/152166/download  Fact Sheet for Healthcare Providers: SeriousBroker.ithttps://www.fda.gov/media/152162/download  This test is not yet approved or cleared by the [place] FDA and  has been authorized for detection and/or diagnosis of SARS-CoV-2 by FDA under an Emergency Use Authorization (EUA).  This EUA will remain in effect (meaning this  test can be used) for the duration of  the COVID-19 declaration under Section 564(b)(1) of the Act, 21 U.S.C. section 360bbb-3(b)(1), unless the authorization is terminated or revoked sooner.     Influenza A by PCR NEGATIVE NEGATIVE Final   Influenza B by PCR NEGATIVE NEGATIVE Final    Comment: (NOTE) The Xpert Xpress SARS-CoV-2/FLU/RSV plus assay is intended as an aid in the diagnosis of influenza from Nasopharyngeal swab specimens and should not be used as a sole basis for treatment. Nasal washings and aspirates are unacceptable for Xpert Xpress SARS-CoV-2/FLU/RSV testing.  Fact Sheet for  Patients: BloggerCourse.com  Fact Sheet for Healthcare Providers: SeriousBroker.it  This test is not yet approved or cleared by the [place] FDA and has been authorized for detection and/or diagnosis of SARS-CoV-2 by FDA under an Emergency Use Authorization (EUA). This EUA will remain in effect (meaning this test can be used) for the duration of the COVID-19 declaration under Section 564(b)(1) of the Act, 21 U.S.C. section 360bbb-3(b)(1), unless the authorization is terminated or revoked.  Performed at [facility], [address], [place], [place] [address]   Blood Culture (routine x 2)     Status: None (Preliminary result)   Collection Time: 06/26/2020  8:43 PM   Specimen: BLOOD RIGHT ARM  Result Value Ref Range Status   Specimen Description BLOOD RIGHT ARM  Final   Special Requests   Final    BOTTLES DRAWN AEROBIC AND ANAEROBIC Blood Culture adequate volume   Culture   Final    NO GROWTH < 12 HOURS Performed at [facility], [address] [place], [place] [address]    Report Status PENDING  Incomplete  Blood Culture (routine x 2)     Status: None (Preliminary result)   Collection Time: 06/21/2020  8:55 PM   Specimen: BLOOD RIGHT HAND  Result Value Ref Range Status   Specimen Description BLOOD RIGHT HAND  Final   Special Requests   Final    BOTTLES DRAWN AEROBIC ONLY Blood Culture results may not be optimal due to an inadequate volume of blood received in culture bottles   Culture   Final    NO GROWTH < 12 HOURS Performed at [facility], [address], [place], [place] [address]    Report Status PENDING  Incomplete     Radiology Studies: DG Chest Portable 1 View  Result Date: 06/10/2020 CLINICAL DATA:  Hypoxia.  Lethargy. EXAM: PORTABLE CHEST 1 VIEW COMPARISON:  None. FINDINGS: Diffuse bilateral pulmonary infiltrates, right greater than left. No pneumothorax. Possible cardiomegaly. The hila and mediastinum are  unremarkable. IMPRESSION: Diffuse bilateral pulmonary infiltrates, right greater than left, worrisome for multifocal pneumonia. Electronically Signed   By: Gerome Sam III M.D   On: 06/13/2020 15:55    Scheduled Meds: . albuterol  2 puff Inhalation Q6H WA  . vitamin C  500 mg Oral Daily  . docusate  100 mg Per Tube BID  . heparin  5,000 Units Subcutaneous Q8H  . levothyroxine  50 mcg Oral Daily  . methylPREDNISolone (SOLU-MEDROL) injection  80 mg Intravenous Q12H  . polyethylene glycol  17 g Per Tube Daily  . zinc sulfate  220 mg Oral Daily   Continuous Infusions: . sodium chloride    . acetaminophen    . midazolam 2 mg/hr (06/17/20 1406)  . propofol (DIPRIVAN)  infusion 5 mcg/kg/min (06/17/20 1326)  . remdesivir 100 mg in NS 100 mL 100 mg (06/17/20 1049)     LOS: 1 day    Time spent: 40 minutes    Vassie Loll, MD [facility]   To contact the attending provider between 7A-7P or the covering provider during after hours 7P-7A, please log into the web site [email or web address] and access using universal [place] password for that web site. If you do not have the password, please call the hospital operator.  06/17/2020, 2:16 PM

## 2020-06-17 NOTE — Progress Notes (Signed)
eLink Physician-Brief Progress Note Patient Name: Courtney Simmons DOB: 21-Jun-1955 MRN: [id number]   Date of Service  06/17/2020  HPI/Events of Note  65 yr old female with hx of Hypothyroidism, HLD transferred from [facility] for Covid-severe ARDS on Vent arrived to unit.  Camera: Discussed with RN, asking for versed gtt. Propofol twice made BP to drop. On fenta at 100. VS stable. 410/10/32/60%. sats 92%. In synchrony.  Not on pressors.  Data: Reviewed P/F > 100. TG 235, CRP 24, Ferritin 938, wbc 10 K, Cr normal.  CxR film seen: ET in place. ARDS. D dimer > 3.  Notified bed side MD team about ICU admit.   eICU Interventions  -Lung protective ventilation. - Versed gtt ordered for RASS -3 - BG goals < 180 - VTE: lovenox - Prone ventilation if worsening - neutral fluid balance. - s/p toci . On remdesivir/steroids.      Intervention Category Major Interventions: Delirium, psychosis, severe agitation - evaluation and management;Respiratory failure - evaluation and management Evaluation Type: New Patient Evaluation  Ranee Gosselin 06/17/2020, 7:37 PM

## 2020-06-17 NOTE — ED Notes (Signed)
Soft restraints removed. Pt is sedated at this time. No longer fighting to pull the tube out.

## 2020-06-17 NOTE — Progress Notes (Signed)
TRH night shift stepdown coverage note.  The patient has been struggling with oxygenation while receiving 40 LPM via HFNC at 100%.  A hospital bariatric bed will be placed in the patient's current room so she can try prone positioning and see if this improves her respiratory status.  Sanda Klein, MD

## 2020-06-17 NOTE — ED Notes (Addendum)
Air leak noted around tube. New tube placed, 23.5 at lip. + color change noted on CO2 detector.

## 2020-06-17 NOTE — ED Notes (Signed)
Pt unable to tolerate lying prone- pt assisted to side lying- pt O2 sats have improved with this intervention.

## 2020-06-17 NOTE — ED Notes (Signed)
Pt attempting to pull at ET tube. Dr Gwenlyn Perking paged to call unit.

## 2020-06-17 NOTE — ED Notes (Signed)
Pt transferred to bariatric bed and placed in prone position. Pt tolerated ok.

## 2020-06-17 NOTE — ED Notes (Signed)
Spoke with dr Robb Matar regarding pt O2 sats and pt condition- instructed to place pt on closed circuit bi-papClarisse Gouge, RT made aware and on way to ED

## 2020-06-17 NOTE — ED Notes (Signed)
Pt starting to wake

## 2020-06-17 NOTE — ED Notes (Signed)
Soft mittens placed on patient

## 2020-06-17 NOTE — Progress Notes (Signed)
RT attempted radial A line x2 unsuccessfully. RT notified RN.

## 2020-06-17 NOTE — ED Notes (Signed)
Pt is hypotensive. 84/50. MD made aware. Propofol turned down to . Per MD stop versed @ this time and continue with propofol & versed pushes.

## 2020-06-17 NOTE — ED Notes (Signed)
Spoke with French Ana, pt daughter in law and updated on pt status and increased oxygen requirements due to low O2 saturation.

## 2020-06-18 ENCOUNTER — Inpatient Hospital Stay (HOSPITAL_COMMUNITY): Payer: BC Managed Care – PPO

## 2020-06-18 DIAGNOSIS — U071 COVID-19: Secondary | ICD-10-CM | POA: Diagnosis not present

## 2020-06-18 DIAGNOSIS — J1282 Pneumonia due to coronavirus disease 2019: Secondary | ICD-10-CM | POA: Diagnosis not present

## 2020-06-18 DIAGNOSIS — J9601 Acute respiratory failure with hypoxia: Secondary | ICD-10-CM | POA: Diagnosis not present

## 2020-06-18 LAB — POCT I-STAT 7, (LYTES, BLD GAS, ICA,H+H)
Acid-base deficit: 3 mmol/L — ABNORMAL HIGH (ref 0.0–2.0)
Acid-base deficit: 7 mmol/L — ABNORMAL HIGH (ref 0.0–2.0)
Bicarbonate: 25.8 mmol/L (ref 20.0–28.0)
Bicarbonate: 20.6 mmol/L (ref 20.0–28.0)
Calcium, Ion: 1.29 mmol/L (ref 1.15–1.40)
Calcium, Ion: 1.29 mmol/L (ref 1.15–1.40)
HCT: 38 % (ref 36.0–46.0)
HCT: 35 % — ABNORMAL LOW (ref 36.0–46.0)
Hemoglobin: 12.9 g/dL (ref 12.0–15.0)
Hemoglobin: 11.9 g/dL — ABNORMAL LOW (ref 12.0–15.0)
O2 Saturation: 100 %
O2 Saturation: 90 %
Patient temperature: 98.2
Patient temperature: 98.9
Potassium: 5.1 mmol/L (ref 3.5–5.1)
Potassium: 4.6 mmol/L (ref 3.5–5.1)
Sodium: 148 mmol/L — ABNORMAL HIGH (ref 135–145)
Sodium: 147 mmol/L — ABNORMAL HIGH (ref 135–145)
TCO2: 28 mmol/L (ref 22–32)
TCO2: 22 mmol/L (ref 22–32)
pCO2 arterial: 59.8 mmHg — ABNORMAL HIGH (ref 32.0–48.0)
pCO2 arterial: 48.1 mmHg — ABNORMAL HIGH (ref 32.0–48.0)
pH, Arterial: 7.241 — ABNORMAL LOW (ref 7.350–7.450)
pH, Arterial: 7.24 — ABNORMAL LOW (ref 7.350–7.450)
pO2, Arterial: 241 mmHg — ABNORMAL HIGH (ref 83.0–108.0)
pO2, Arterial: 71 mmHg — ABNORMAL LOW (ref 83.0–108.0)

## 2020-06-18 LAB — GLUCOSE, CAPILLARY
Glucose-Capillary: 144 mg/dL — ABNORMAL HIGH (ref 70–99)
Glucose-Capillary: 144 mg/dL — ABNORMAL HIGH (ref 70–99)
Glucose-Capillary: 142 mg/dL — ABNORMAL HIGH (ref 70–99)
Glucose-Capillary: 158 mg/dL — ABNORMAL HIGH (ref 70–99)
Glucose-Capillary: 181 mg/dL — ABNORMAL HIGH (ref 70–99)
Glucose-Capillary: 205 mg/dL — ABNORMAL HIGH (ref 70–99)

## 2020-06-18 LAB — COMPREHENSIVE METABOLIC PANEL
ALT: 32 U/L (ref 0–44)
AST: 55 U/L — ABNORMAL HIGH (ref 15–41)
Albumin: 2.5 g/dL — ABNORMAL LOW (ref 3.5–5.0)
Alkaline Phosphatase: 114 U/L (ref 38–126)
Anion gap: 10 (ref 5–15)
BUN: 29 mg/dL — ABNORMAL HIGH (ref 8–23)
CO2: 22 mmol/L (ref 22–32)
Calcium: 8.5 mg/dL — ABNORMAL LOW (ref 8.9–10.3)
Chloride: 116 mmol/L — ABNORMAL HIGH (ref 98–111)
Creatinine, Ser: 1.16 mg/dL — ABNORMAL HIGH (ref 0.44–1.00)
GFR, Estimated: 53 mL/min — ABNORMAL LOW (ref >60)
Glucose, Bld: 148 mg/dL — ABNORMAL HIGH (ref 70–99)
Potassium: 5.2 mmol/L — ABNORMAL HIGH (ref 3.5–5.1)
Sodium: 148 mmol/L — ABNORMAL HIGH (ref 135–145)
Total Bilirubin: 0.6 mg/dL (ref 0.3–1.2)
Total Protein: 6.8 g/dL (ref 6.5–8.1)

## 2020-06-18 LAB — CBC WITH DIFFERENTIAL/PLATELET
Abs Immature Granulocytes: 0 10*3/uL (ref 0.00–0.07)
Basophils Absolute: 0 10*3/uL (ref 0.0–0.1)
Basophils Relative: 0 %
Eosinophils Absolute: 0 10*3/uL (ref 0.0–0.5)
Eosinophils Relative: 0 %
HCT: 41.5 % (ref 36.0–46.0)
Hemoglobin: 12.3 g/dL (ref 12.0–15.0)
Lymphocytes Relative: 4 %
Lymphs Abs: 0.5 10*3/uL — ABNORMAL LOW (ref 0.7–4.0)
MCH: 28.8 pg (ref 26.0–34.0)
MCHC: 29.6 g/dL — ABNORMAL LOW (ref 30.0–36.0)
MCV: 97.2 fL (ref 80.0–100.0)
Monocytes Absolute: 0 10*3/uL — ABNORMAL LOW (ref 0.1–1.0)
Monocytes Relative: 0 %
Neutro Abs: 11.2 10*3/uL — ABNORMAL HIGH (ref 1.7–7.7)
Neutrophils Relative %: 96 %
Platelets: 304 10*3/uL (ref 150–400)
RBC: 4.27 MIL/uL (ref 3.87–5.11)
RDW: 14.7 % (ref 11.5–15.5)
WBC: 11.7 10*3/uL — ABNORMAL HIGH (ref 4.0–10.5)
nRBC: 0.5 % — ABNORMAL HIGH (ref 0.0–0.2)
nRBC: 1 /100 WBC — ABNORMAL HIGH

## 2020-06-18 LAB — ECHOCARDIOGRAM COMPLETE
Area-P 1/2: 3.99 cm2
Calc EF: 83.4 %
Height: 67 in
S' Lateral: 3.4 cm
Single Plane A2C EF: 89 %
Single Plane A4C EF: 76.1 %
Weight: 4479.75 oz

## 2020-06-18 LAB — PHOSPHORUS
Phosphorus: 3 mg/dL (ref 2.5–4.6)
Phosphorus: 3 mg/dL (ref 2.5–4.6)
Phosphorus: 3.5 mg/dL (ref 2.5–4.6)

## 2020-06-18 LAB — MAGNESIUM
Magnesium: 2.4 mg/dL (ref 1.7–2.4)
Magnesium: 2.6 mg/dL — ABNORMAL HIGH (ref 1.7–2.4)
Magnesium: 2.7 mg/dL — ABNORMAL HIGH (ref 1.7–2.4)

## 2020-06-18 LAB — HEMOGLOBIN A1C
Hgb A1c MFr Bld: 6 % — ABNORMAL HIGH (ref 4.8–5.6)
Mean Plasma Glucose: 125.5 mg/dL

## 2020-06-18 LAB — FERRITIN: Ferritin: 814 ng/mL — ABNORMAL HIGH (ref 11–307)

## 2020-06-18 LAB — D-DIMER, QUANTITATIVE: D-Dimer, Quant: >20 ug/mL-FEU — ABNORMAL HIGH (ref 0.00–0.50)

## 2020-06-18 LAB — TSH: TSH: 0.363 u[IU]/mL (ref 0.350–4.500)

## 2020-06-18 LAB — C-REACTIVE PROTEIN: CRP: 16 mg/dL — ABNORMAL HIGH (ref <1.0)

## 2020-06-18 MED ORDER — GUAIFENESIN-DM 100-10 MG/5ML PO SYRP: 10.0000 mL | ORAL_SOLUTION | ORAL | Status: DC | PRN

## 2020-06-18 MED ORDER — VITAL 1.5 CAL PO LIQD
1000.0000 mL | ORAL | Status: DC
  Administered 2020-06-18 – 2020-06-23 (×5): 1000 mL
  Filled 2020-06-18 (×6): qty 1000

## 2020-06-18 MED ORDER — FREE WATER
200.0000 mL | Status: DC
  Administered 2020-06-18 – 2020-06-19 (×7): 200 mL

## 2020-06-18 MED ORDER — HEPARIN (PORCINE) 25000 UT/250ML-% IV SOLN
1000.0000 [IU]/h | INTRAVENOUS | Status: AC
  Administered 2020-06-18: 20:00:00 1500 [IU]/h via INTRAVENOUS
  Administered 2020-06-20 – 2020-06-21 (×2): 1000 [IU]/h via INTRAVENOUS
  Administered 2020-06-21 – 2020-06-23 (×3): 1200 [IU]/h via INTRAVENOUS
  Administered 2020-06-25 (×2): 1150 [IU]/h via INTRAVENOUS
  Filled 2020-06-18 (×12): qty 250

## 2020-06-18 MED ORDER — ZINC SULFATE 220 (50 ZN) MG PO CAPS
220.0000 mg | ORAL_CAPSULE | Freq: Every day | ORAL | Status: DC
  Administered 2020-06-19 – 2020-06-28 (×10): 220 mg
  Filled 2020-06-18 (×10): qty 1

## 2020-06-18 MED ORDER — ASCORBIC ACID 500 MG PO TABS
500.0000 mg | ORAL_TABLET | Freq: Every day | ORAL | Status: DC
  Administered 2020-06-19 – 2020-06-28 (×10): 500 mg
  Filled 2020-06-18 (×10): qty 1

## 2020-06-18 MED ORDER — HEPARIN BOLUS VIA INFUSION
2500.0000 [IU] | Freq: Once | INTRAVENOUS | Status: AC
  Administered 2020-06-18: 2500 [IU] via INTRAVENOUS
  Filled 2020-06-18: qty 2500

## 2020-06-18 MED ORDER — TOCILIZUMAB 162 MG/0.9ML ~~LOC~~ SOSY
810.0000 mg | PREFILLED_SYRINGE | Freq: Once | SUBCUTANEOUS | Status: AC
  Administered 2020-06-18: 810 mg via INTRAVENOUS
  Filled 2020-06-18: qty 4.5

## 2020-06-18 MED ORDER — ONDANSETRON HCL 4 MG PO TABS: 4.0000 mg | ORAL_TABLET | Freq: Four times a day (QID) | ORAL | Status: DC | PRN

## 2020-06-18 MED ORDER — PROSOURCE TF PO LIQD
45.0000 mL | Freq: Four times a day (QID) | ORAL | Status: DC
  Administered 2020-06-18 – 2020-06-28 (×42): 45 mL
  Filled 2020-06-18 (×40): qty 45

## 2020-06-18 MED ORDER — LEVOTHYROXINE SODIUM 50 MCG PO TABS
50.0000 ug | ORAL_TABLET | Freq: Every day | ORAL | Status: DC
  Administered 2020-06-18 – 2020-06-28 (×11): 50 ug
  Filled 2020-06-18 (×12): qty 1

## 2020-06-18 MED ORDER — PERFLUTREN LIPID MICROSPHERE
1.0000 mL | INTRAVENOUS | Status: AC | PRN
  Administered 2020-06-18: 2 mL via INTRAVENOUS
  Filled 2020-06-18: qty 10

## 2020-06-18 MED ORDER — SIMVASTATIN 20 MG PO TABS
20.0000 mg | ORAL_TABLET | Freq: Every day | ORAL | Status: DC
  Administered 2020-06-18 – 2020-06-27 (×10): 20 mg
  Filled 2020-06-18 (×11): qty 1

## 2020-06-18 MED ORDER — ONDANSETRON HCL 4 MG/2ML IJ SOLN: 4.0000 mg | Freq: Four times a day (QID) | INTRAMUSCULAR | Status: DC | PRN

## 2020-06-18 MED ORDER — ALBUTEROL SULFATE (2.5 MG/3ML) 0.083% IN NEBU
2.5000 mg | INHALATION_SOLUTION | Freq: Four times a day (QID) | RESPIRATORY_TRACT | Status: DC
  Administered 2020-06-18 – 2020-06-21 (×12): 2.5 mg via RESPIRATORY_TRACT
  Filled 2020-06-18 (×12): qty 3

## 2020-06-18 MED ORDER — ACETAMINOPHEN 325 MG PO TABS
650.0000 mg | ORAL_TABLET | Freq: Four times a day (QID) | ORAL | Status: DC | PRN
  Filled 2020-06-18: qty 2

## 2020-06-18 MED ORDER — METHYLPREDNISOLONE SODIUM SUCC 125 MG IJ SOLR
125.0000 mg | Freq: Two times a day (BID) | INTRAMUSCULAR | Status: DC
  Administered 2020-06-18 – 2020-06-22 (×9): 125 mg via INTRAVENOUS
  Filled 2020-06-18 (×9): qty 2

## 2020-06-18 NOTE — CV Procedure (Signed)
BLE venous duplex completed. Preliminary findings given to Jose,RN by Odilon Cass RVT/RDMS at 1805.   Results can be found under chart review under CV PROC. 06/18/2020 8:33 PM Deniese Oberry RVT, RDMS

## 2020-06-18 NOTE — Progress Notes (Signed)
Initial Nutrition Assessment  DOCUMENTATION CODES:   Morbid obesity  INTERVENTION:   Tube Feeding via OG: Vital 1.5 at 50 ml/hr Begin TF at 20 m/hr; titrate by 10 mL q 8 hours until goal rate of 50 ml/hr Pro-Source TF 45 mL QID Provides 1960 kcals, 125 g of protein and 912 mL of free water Meets 100% estimated calorie and protein needs  Hypernatremic; add free water flushes  NUTRITION DIAGNOSIS:   Inadequate oral intake related to acute illness as evidenced by NPO status.  GOAL:   Patient will meet greater than or equal to 90% of their needs  MONITOR:   Vent status,Weight trends,TF tolerance,Labs,Skin  REASON FOR ASSESSMENT:   Consult,Ventilator Enteral/tube feeding initiation and management  ASSESSMENT:   65 yo unvaccinated female admitted with acute respiratory failure with COVID-19 pneumonia requiring inutbation. Pt with N/V/D since 12/31.  PMH includes hypothyroidism, HLD  1/09 Intubated  Pt remains sedated on vent, fentanyl and versed. Not requiring pressors  OG tube "enters abdomen" per chest xray  Unable to obtain diet and weight history  Labs: sodium 148 (H), potassium 5.2 (H), Creatinine 1.16, BUN 29 Meds: decadron, Vit C, zinc  Diet Order:   Diet Order            Diet NPO time specified  Diet effective now                 EDUCATION NEEDS:   Not appropriate for education at this time  Skin:  Skin Assessment: Reviewed RN Assessment  Last BM:  no documented BM  Height:   Ht Readings from Last 1 Encounters:  06/25/2020 5\' 7"  (1.702 m)    Weight:   Wt Readings from Last 1 Encounters:  06/19/2020 127 kg    BMI:  Body mass index is 43.85 kg/m.  Estimated Nutritional Needs:   Kcal:  1900-2100 kcals  Protein:  125-145 g  Fluid:  >/= 2 L   08/14/20 MS, RDN, LDN, CNSC Registered Dietitian III Clinical Nutrition RD Pager and On-Call Pager Number Located in [place]

## 2020-06-18 NOTE — Progress Notes (Signed)
Echocardiogram 2D Echocardiogram with definity has been performed.  Courtney Simmons M 06/18/2020, 3:01 PM

## 2020-06-18 NOTE — Progress Notes (Signed)
ANTICOAGULATION CONSULT NOTE - Initial Consult  Pharmacy Consult for Heparin Indication: DVT  No Known Allergies  Patient Measurements: Height: 5\' 7"  (170.2 cm) Weight: 127 kg (279 lb 15.8 oz) IBW/kg (Calculated) : 61.6 Heparin Dosing Weight: 92 kg  Vital Signs: Temp: 95 F (35 C) (01/10 1600) Temp Source: Axillary (01/10 1600) BP: 114/72 (01/10 1700) Pulse Rate: 60 (01/10 1700)  Labs: Recent Labs    July 12, 2020 1515 06/17/20 0556 06/18/20 0159 06/18/20 1336  HGB 13.6 13.0 12.3 12.9  HCT 43.6 41.8 41.5 38.0  PLT 339 331 304  --   CREATININE 1.70* 0.84 1.16*  --     Estimated Creatinine Clearance: 68.8 mL/min (A) (by C-G formula based on SCr of 1.16 mg/dL (H)).   Medical History: Past Medical History:  Diagnosis Date  . Hyperlipidemia   . Hypothyroidism   . Kidney stone   . Morbid obesity (HCC) 01/02/2012  . Obesity     Medications:  Infusions:  . sodium chloride 50 mL/hr at 06/18/20 0800  . feeding supplement (VITAL 1.5 CAL) 1,000 mL (06/18/20 1039)  . fentaNYL infusion INTRAVENOUS 90 mcg/hr (06/18/20 1636)  . midazolam 0.5 mg/hr (06/18/20 0800)  . remdesivir 100 mg in NS 100 mL 100 mg (06/18/20 0943)    Assessment: 12 YOF who presented on 1/8 with COVID PNA and now found to have a DVT. Pharmacy consulted to start Heparin for anticoagulation.   The patient has been receiving Heparin SQ for DVT prophylaxis - last dose at 1430. Will only give 1/2 bolus with initiation due to this. CBC wnl, Hep wt: 92 kg.   Goal of Therapy:  Heparin level 0.3-0.7 units/ml Monitor platelets by anticoagulation protocol: Yes   Plan:  - Heparin 2500 unit bolus x 1 - Start Heparin at 1500 units/hr (15 ml/hr) - Daily HL/CBC - Will continue to monitor for any signs/symptoms of bleeding and will follow up with heparin level in 6 hours   Thank you for allowing pharmacy to be a part of this patient's care.  3/8, PharmD, BCPS Clinical Pharmacist Clinical phone for  06/18/2020: [phone] 06/18/2020 6:34 PM   **Pharmacist phone directory can now be found on [email or web address] (PW [email or web address]).  Listed under [facility].

## 2020-06-18 NOTE — Progress Notes (Signed)
eLink Physician-Brief Progress Note Patient Name: Courtney Simmons DOB: 09-Mar-1956 MRN: [id number]   Date of Service  06/18/2020  HPI/Events of Note  Nursing request for ABG to determine need for pronation therapy.   eICU Interventions  Plan: 1.  ABG STAT.     Intervention Category Major Interventions: Hypoxemia - evaluation and management  Khamille Beynon Eugene 06/18/2020, 10:14 PM

## 2020-06-18 NOTE — Progress Notes (Addendum)
NAME:  Courtney Simmons, MRN:  [id number], DOB:  1956-03-27, LOS: 2 ADMISSION DATE:  07/03/2020, CONSULTATION DATE: 06/18/19 REFERRING MD:  Vanetta Mulders, CHIEF COMPLAINT:  Shortness of breath/respiratory failure.   Brief History:  65 yo woman with obesity hypothyroism, COVID PNA  History of Present Illness:  Courtney Simmons is a 65 year old woman Morbid obesity, hypothyroidism, found by family in bed with cyanotic lips.   She had been sick with N/V/D since 12/31, had been feeling better prior 2 days.  Not vaccinated against coronavirus.  Very hypoxic initially 39% on room air, brought to [facility] with escalating oxygen needs, intubated this afternoon.    Hypotensive temporarily.   Remains on propofol.   Central line placed.    Phone visit with primary care on 1/6 : fever and diarrhea since 12/31.  .  Fever intermittent x 3 weeks.  Non bloody, watery.    Upon arrival to [facility], was very profoundly hypoxic with any movement or turning, recovered with sedation.  Was also hypoxemic with movement to gurney for transport.  Also had been transiently hypotensive with movement.   Received about 2L fluid on my review of chart. Foley in place with some slightly dark urine.    Past Medical History:  Hypothyroidism HLD Nephrolithiasis obesity  Hx of back surgery, breast biopsy, ankle surgery  Home meds: prednisone taper 9/21, vitamin D, zocor, levoxyl, prn advil and tylenol Significant Hospital Events:  Intubated 1/9  Consults:    Procedures:  Intubated 1/9 Central line R femoral 1/9  Significant Diagnostic Tests:  CXR 06/17/20 : Bilateral lung opacities with central air bronchograms 06/17/20: D-dimer 3.59, CRP 24, ferritin 938  Micro Data:   06/19/2020: Covid + Blood culture 06/20/2020 >> no growth to date x2 days  Antimicrobials:  Remdesivir  Interim History / Subjective:  Propofol caused hypotension twice last night, currently on fentanyl drip. She opens eyes to verbal stimulation.   Does not follow commands.  Remains on fentanyl drip.  Objective   Blood pressure 108/68, pulse (!) 45, temperature 98.2 F (36.8 C), temperature source Axillary, resp. rate (!) 23, height 5\' 7"  (1.702 m), weight 127 kg, SpO2 97 %. CVP:  [33 mmHg] 33 mmHg  Vent Mode: PRVC FiO2 (%):  [100 %] 100 % Set Rate:  [22 bmp-26 bmp] 26 bmp Vt Set:  [450 mL-500 mL] 450 mL PEEP:  [10 cmH20-16 cmH20] 16 cmH20 Plateau Pressure:  [24 cmH20-37 cmH20] 36 cmH20   Intake/Output Summary (Last 24 hours) at 06/18/2020 08/16/2020 Last data filed at 06/18/2020 0800 Gross per 24 hour  Intake 505.31 ml  Output 325 ml  Net 180.31 ml   Filed Weights   07/09/2020 1516  Weight: 127 kg    Examination: General: Remains on fentanyl drip, intubated, opens eyes to voice, no acute distress HENT: NCAT Lungs: Decreased breath sounds, breathing comfortably, ventilator Cardiovascular: Bradycardic, no murmurs, rubs or gallops   Abdomen: obese, no obvious tenderness,  Extremities:  No edema or erythema, cold extremities Neuro: Opens eyes to voice, not following commands   Resolved Hospital Problem list     Assessment & Plan:  Acute hypoxic and hypercarbic respiratory failure secondary to Covid pneumonia: Received Tocilizumab.  Currently on remdesivir and steroids. Intubated, on pressure support with FiO2 100%, PEEP 16, oxygenating well.  Currently afebrile without leukocytosis.  Patient has been having intermittent episodes of hypotension and bradycardia secondary to sedation, patient was started on versed drip and versed PRN for sedation. Also has significantly  elevated D dimer, likely 2/2 COVID however PE cannot be ruled out.   -Continue remdesivir -Switch from decadron to solumedrol 125 mg BID -Repeat tociluzimab -Adjust ventilator support, increase PEEP to 20, RR to 35, decrease TV to 370 -Repeat ABG in PM, consider proning if able however patient has intermittent episodes of hemodynamic instability -Goal PH  7.3-7.45 -Peak pressure goal <30, driving pressure <89.  -Wean fentanyl as able -Wean versed as able -Obtain LE dopplers and echocardiogram  Hx hypothyroidism:  -Continue home levothyroxine 50 mcg daily  HLD: -Continue simvastatin  Hypernatremia: Na 148 today, on free water 200 q4 hours per tube. -Continue free water -Daily BMP  Best practice (evaluated daily)  Diet: Start tube feeds  Pain/Anxiety/Delirium protocol (if indicated): fentanyl versed  VAP protocol (if indicated): yes DVT prophylaxis: heparin TID GI prophylaxis: protonix Glucose control: iss prn  Mobility:  Disposition:ICU  Goals of Care:  Last date of multidisciplinary goals of care discussion: Family and staff present: None. Contacted daughter and daughter in law regarding patient status Summary of discussion:  Follow up goals of care discussion due:  Code Status: Full Code   Labs   CBC: Recent Labs  Lab 06/29/2020 1515 06/17/20 0556 06/18/20 0159  WBC 8.3 10.5 11.7*  NEUTROABS 6.8 8.9* 11.2*  HGB 13.6 13.0 12.3  HCT 43.6 41.8 41.5  MCV 94.4 94.6 97.2  PLT 339 331 304    Basic Metabolic Panel: Recent Labs  Lab 06/10/2020 1515 06/17/20 0556 06/18/20 0159  NA 142 145 148*  K 3.9 4.2 5.2*  CL 109 116* 116*  CO2 20* 19* 22  GLUCOSE 102* 124* 148*  BUN 49* 31* 29*  CREATININE 1.70* 0.84 1.16*  CALCIUM 8.5* 8.3* 8.5*  MG  --  2.2 2.4  PHOS  --  2.0* 3.0   GFR: Estimated Creatinine Clearance: 68.8 mL/min (A) (by C-G formula based on SCr of 1.16 mg/dL (H)). Recent Labs  Lab 06/27/2020 1515 06/20/2020 1702 06/17/20 0556 06/18/20 0159  PROCALCITON  --  0.69  --   --   WBC 8.3  --  10.5 11.7*    Liver Function Tests: Recent Labs  Lab 07/09/2020 1515 06/17/20 0556 06/18/20 0159  AST 69* 58* 55*  ALT 42 34 32  ALKPHOS 98 94 114  BILITOT 0.7 0.7 0.6  PROT 7.9 7.3 6.8  ALBUMIN 3.2* 2.9* 2.5*   No results for input(s): LIPASE, AMYLASE in the last 168 hours. No results for input(s):  AMMONIA in the last 168 hours.  ABG    Component Value Date/Time   PHART 7.206 (L) 06/17/2020 2035   PCO2ART 51.3 (H) 06/17/2020 2035   PO2ART 116 (H) 06/17/2020 2035   HCO3 19.4 (L) 06/17/2020 2035   ACIDBASEDEF 7.1 (H) 06/17/2020 2035   O2SAT 97.1 06/17/2020 2035     Coagulation Profile: No results for input(s): INR, PROTIME in the last 168 hours.  Cardiac Enzymes: No results for input(s): CKTOTAL, CKMB, CKMBINDEX, TROPONINI in the last 168 hours.  HbA1C: Hgb A1c MFr Bld  Date/Time Value Ref Range Status  06/18/2020 02:00 AM 6.0 (H) 4.8 - 5.6 % Final    Comment:    (NOTE) Pre diabetes:          5.7%-6.4%  Diabetes:              >6.4%  Glycemic control for   <7.0% adults with diabetes     CBG: Recent Labs  Lab 06/17/20 2022 06/17/20 2340 06/18/20 0341 06/18/20 0739  GLUCAP 134* 133* 144* 144*    Review of Systems:   Unable to assess  Past Medical History:  She,  has a past medical history of Hyperlipidemia, Hypothyroidism, Kidney stone, Morbid obesity (HCC) (01/02/2012), and Obesity.   Surgical History:   Past Surgical History:  Procedure Laterality Date  . BACK SURGERY  1992   no prolems with surgery  . BREAST BIOPSY    . cyst removed from throat  1980s  . ORIF ANKLE FRACTURE  01/02/2012   Procedure: OPEN REDUCTION INTERNAL FIXATION (ORIF) ANKLE FRACTURE;  Surgeon: Darreld Mclean, MD;  Location: [facility];  Service: Orthopedics;  Laterality: Left;     Social History:   reports that she has never smoked. She has never used smokeless tobacco. She reports that she does not drink alcohol and does not use drugs.   Family History:  Her family history includes Cancer in her brother; Coronary artery disease in her father and sister; Diabetes in her mother and sister; Gout in her father; Heart attack (age of onset: 62) in her mother; Hypertension in her mother; Obesity in her sister; Stroke in her sister; Stroke (age of onset: 46) in her brother. There is no  history of Colon cancer, Esophageal cancer, Rectal cancer, or Stomach cancer.   Allergies No Known Allergies   Home Medications  Prior to Admission medications   Medication Sig Start Date End Date Taking? Authorizing Provider  acetaminophen (TYLENOL) 325 MG tablet Take 650 mg by mouth every 6 (six) hours as needed.   Yes [provider]  ibuprofen (ADVIL) 200 MG tablet Take 200 mg by mouth every 6 (six) hours as needed.   Yes [provider]  levothyroxine (SYNTHROID) 50 MCG tablet Take 1 tablet (50 mcg total) by mouth daily. 07/05/19 07/04/20 Yes Junie Spencer, FNP   Claudean Severance, M.D. PGY3 Pager [phone] 06/18/2020 9:24 AM

## 2020-06-19 ENCOUNTER — Inpatient Hospital Stay (HOSPITAL_COMMUNITY): Payer: BC Managed Care – PPO

## 2020-06-19 DIAGNOSIS — U071 COVID-19: Secondary | ICD-10-CM | POA: Diagnosis not present

## 2020-06-19 DIAGNOSIS — J1282 Pneumonia due to coronavirus disease 2019: Secondary | ICD-10-CM | POA: Diagnosis not present

## 2020-06-19 LAB — POCT I-STAT 7, (LYTES, BLD GAS, ICA,H+H)
Acid-base deficit: 9 mmol/L — ABNORMAL HIGH (ref 0.0–2.0)
Bicarbonate: 18.8 mmol/L — ABNORMAL LOW (ref 20.0–28.0)
Calcium, Ion: 1.33 mmol/L (ref 1.15–1.40)
HCT: 36 % (ref 36.0–46.0)
Hemoglobin: 12.2 g/dL (ref 12.0–15.0)
O2 Saturation: 86 %
Patient temperature: 99.2
Potassium: 4.2 mmol/L (ref 3.5–5.1)
Sodium: 148 mmol/L — ABNORMAL HIGH (ref 135–145)
TCO2: 20 mmol/L — ABNORMAL LOW (ref 22–32)
pCO2 arterial: 47.3 mmHg (ref 32.0–48.0)
pH, Arterial: 7.208 — ABNORMAL LOW (ref 7.350–7.450)
pO2, Arterial: 63 mmHg — ABNORMAL LOW (ref 83.0–108.0)

## 2020-06-19 LAB — PHOSPHORUS
Phosphorus: 3 mg/dL (ref 2.5–4.6)
Phosphorus: 2.5 mg/dL (ref 2.5–4.6)

## 2020-06-19 LAB — GLUCOSE, CAPILLARY
Glucose-Capillary: 179 mg/dL — ABNORMAL HIGH (ref 70–99)
Glucose-Capillary: 202 mg/dL — ABNORMAL HIGH (ref 70–99)
Glucose-Capillary: 205 mg/dL — ABNORMAL HIGH (ref 70–99)
Glucose-Capillary: 214 mg/dL — ABNORMAL HIGH (ref 70–99)
Glucose-Capillary: 239 mg/dL — ABNORMAL HIGH (ref 70–99)
Glucose-Capillary: 276 mg/dL — ABNORMAL HIGH (ref 70–99)

## 2020-06-19 LAB — COMPREHENSIVE METABOLIC PANEL
ALT: 27 U/L (ref 0–44)
AST: 31 U/L (ref 15–41)
Albumin: 2.8 g/dL — ABNORMAL LOW (ref 3.5–5.0)
Alkaline Phosphatase: 111 U/L (ref 38–126)
Anion gap: 12 (ref 5–15)
BUN: 58 mg/dL — ABNORMAL HIGH (ref 8–23)
CO2: 20 mmol/L — ABNORMAL LOW (ref 22–32)
Calcium: 8.7 mg/dL — ABNORMAL LOW (ref 8.9–10.3)
Chloride: 115 mmol/L — ABNORMAL HIGH (ref 98–111)
Creatinine, Ser: 1.09 mg/dL — ABNORMAL HIGH (ref 0.44–1.00)
GFR, Estimated: 57 mL/min — ABNORMAL LOW (ref >60)
Glucose, Bld: 198 mg/dL — ABNORMAL HIGH (ref 70–99)
Potassium: 4.8 mmol/L (ref 3.5–5.1)
Sodium: 147 mmol/L — ABNORMAL HIGH (ref 135–145)
Total Bilirubin: 0.7 mg/dL (ref 0.3–1.2)
Total Protein: 6.7 g/dL (ref 6.5–8.1)

## 2020-06-19 LAB — CBC WITH DIFFERENTIAL/PLATELET
Abs Immature Granulocytes: 0.41 10*3/uL — ABNORMAL HIGH (ref 0.00–0.07)
Basophils Absolute: 0 10*3/uL (ref 0.0–0.1)
Basophils Relative: 0 %
Eosinophils Absolute: 0 10*3/uL (ref 0.0–0.5)
Eosinophils Relative: 0 %
HCT: 39.6 % (ref 36.0–46.0)
Hemoglobin: 12.5 g/dL (ref 12.0–15.0)
Immature Granulocytes: 3 %
Lymphocytes Relative: 4 %
Lymphs Abs: 0.7 10*3/uL (ref 0.7–4.0)
MCH: 30.3 pg (ref 26.0–34.0)
MCHC: 31.6 g/dL (ref 30.0–36.0)
MCV: 95.9 fL (ref 80.0–100.0)
Monocytes Absolute: 0.4 10*3/uL (ref 0.1–1.0)
Monocytes Relative: 2 %
Neutro Abs: 14.9 10*3/uL — ABNORMAL HIGH (ref 1.7–7.7)
Neutrophils Relative %: 91 %
Platelets: 263 10*3/uL (ref 150–400)
RBC: 4.13 MIL/uL (ref 3.87–5.11)
RDW: 14.6 % (ref 11.5–15.5)
WBC: 16.4 10*3/uL — ABNORMAL HIGH (ref 4.0–10.5)
nRBC: 0.5 % — ABNORMAL HIGH (ref 0.0–0.2)

## 2020-06-19 LAB — C-REACTIVE PROTEIN: CRP: 7.1 mg/dL — ABNORMAL HIGH (ref <1.0)

## 2020-06-19 LAB — HEPARIN LEVEL (UNFRACTIONATED)
Heparin Unfractionated: 0.75 IU/mL — ABNORMAL HIGH (ref 0.30–0.70)
Heparin Unfractionated: 0.96 IU/mL — ABNORMAL HIGH (ref 0.30–0.70)
Heparin Unfractionated: 0.8 IU/mL — ABNORMAL HIGH (ref 0.30–0.70)

## 2020-06-19 LAB — D-DIMER, QUANTITATIVE: D-Dimer, Quant: >20 ug/mL-FEU — ABNORMAL HIGH (ref 0.00–0.50)

## 2020-06-19 LAB — MAGNESIUM
Magnesium: 2.8 mg/dL — ABNORMAL HIGH (ref 1.7–2.4)
Magnesium: 3.1 mg/dL — ABNORMAL HIGH (ref 1.7–2.4)

## 2020-06-19 LAB — FERRITIN: Ferritin: 709 ng/mL — ABNORMAL HIGH (ref 11–307)

## 2020-06-19 MED ORDER — FREE WATER
350.0000 mL | Status: DC
  Administered 2020-06-19 – 2020-06-23 (×24): 350 mL

## 2020-06-19 MED ORDER — FENTANYL CITRATE (PF) 100 MCG/2ML IJ SOLN
50.0000 ug | Freq: Once | INTRAMUSCULAR | Status: AC
  Administered 2020-06-19: 50 ug via INTRAVENOUS
  Filled 2020-06-19: qty 2

## 2020-06-19 MED ORDER — VECURONIUM BROMIDE 10 MG IV SOLR
10.0000 mg | INTRAVENOUS | Status: DC | PRN
  Administered 2020-06-19 – 2020-06-28 (×17): 10 mg via INTRAVENOUS
  Filled 2020-06-19 (×18): qty 10

## 2020-06-19 MED ORDER — FENTANYL BOLUS VIA INFUSION
50.0000 ug | INTRAVENOUS | Status: DC | PRN
Start: 2020-06-19 — End: 2020-06-28
  Administered 2020-06-21 – 2020-06-28 (×15): 50 ug via INTRAVENOUS
  Filled 2020-06-19: qty 50

## 2020-06-19 MED ORDER — INSULIN ASPART 100 UNIT/ML ~~LOC~~ SOLN
0.0000 [IU] | SUBCUTANEOUS | Status: DC
  Administered 2020-06-19: 5 [IU] via SUBCUTANEOUS
  Administered 2020-06-19: 8 [IU] via SUBCUTANEOUS
  Administered 2020-06-19: 5 [IU] via SUBCUTANEOUS
  Administered 2020-06-20: 8 [IU] via SUBCUTANEOUS
  Administered 2020-06-20: 5 [IU] via SUBCUTANEOUS

## 2020-06-19 MED ORDER — MIDAZOLAM 50MG/50ML (1MG/ML) PREMIX INFUSION
2.0000 mg/h | INTRAVENOUS | Status: DC
  Administered 2020-06-19: 10 mg/h via INTRAVENOUS
  Administered 2020-06-20 (×2): 7 mg/h via INTRAVENOUS
  Administered 2020-06-20: 10:00:00 6 mg/h via INTRAVENOUS
  Administered 2020-06-20: 03:00:00 10 mg/h via INTRAVENOUS
  Administered 2020-06-21 (×3): 8 mg/h via INTRAVENOUS
  Administered 2020-06-21 (×2): 7 mg/h via INTRAVENOUS
  Administered 2020-06-22 (×3): 8 mg/h via INTRAVENOUS
  Administered 2020-06-23 – 2020-06-26 (×15): 10 mg/h via INTRAVENOUS
  Administered 2020-06-26: 8 mg/h via INTRAVENOUS
  Administered 2020-06-26: 10 mg/h via INTRAVENOUS
  Administered 2020-06-26 – 2020-06-28 (×8): 8 mg/h via INTRAVENOUS
  Filled 2020-06-19 (×3): qty 50
  Filled 2020-06-19: qty 300
  Filled 2020-06-19 (×35): qty 50

## 2020-06-19 MED ORDER — ARTIFICIAL TEARS OPHTHALMIC OINT
1.0000 "application " | TOPICAL_OINTMENT | Freq: Three times a day (TID) | OPHTHALMIC | Status: DC
  Administered 2020-06-19 – 2020-06-28 (×25): 1 via OPHTHALMIC
  Filled 2020-06-19: qty 3.5

## 2020-06-19 MED ORDER — FENTANYL 2500MCG IN NS 250ML (10MCG/ML) PREMIX INFUSION
50.0000 ug/h | INTRAVENOUS | Status: DC
  Administered 2020-06-19: 250 ug/h via INTRAVENOUS
  Administered 2020-06-20: 17:00:00 125 ug/h via INTRAVENOUS
  Administered 2020-06-21 (×2): 175 ug/h via INTRAVENOUS
  Administered 2020-06-22 – 2020-06-28 (×12): 200 ug/h via INTRAVENOUS
  Filled 2020-06-19 (×17): qty 250

## 2020-06-19 MED ORDER — MIDAZOLAM BOLUS VIA INFUSION
1.0000 mg | INTRAVENOUS | Status: DC | PRN
  Administered 2020-06-21 – 2020-06-22 (×5): 2 mg via INTRAVENOUS
  Administered 2020-06-23 – 2020-06-25 (×3): 1 mg via INTRAVENOUS
  Administered 2020-06-26 – 2020-06-28 (×5): 2 mg via INTRAVENOUS
  Filled 2020-06-19: qty 2

## 2020-06-19 NOTE — Progress Notes (Signed)
RT note. Pt. Head turned from Rt to Lt side without any complications, able to pass cath. RT will continue to monitor.

## 2020-06-19 NOTE — Progress Notes (Addendum)
NAME:  Courtney Simmons, MRN:  [id number], DOB:  01-25-1956, LOS: 3 ADMISSION DATE:  07/07/20, CONSULTATION DATE: 06/18/19 REFERRING MD:  Vanetta Mulders, CHIEF COMPLAINT:  Shortness of breath/respiratory failure.   Brief History:  65 yo woman with obesity hypothyroism, COVID PNA  History of Present Illness:  Courtney Simmons is a 65 year old woman Morbid obesity, hypothyroidism, found by family in bed with cyanotic lips.   She had been sick with N/V/D since 12/31, had been feeling better prior 2 days.  Not vaccinated against coronavirus.  Very hypoxic initially 39% on room air, brought to [facility] with escalating oxygen needs, intubated this afternoon.    Hypotensive temporarily.   Remains on propofol.   Central line placed.    Phone visit with primary care on 1/6 : fever and diarrhea since 12/31.  .  Fever intermittent x 3 weeks.  Non bloody, watery.    Upon arrival to [facility], was very profoundly hypoxic with any movement or turning, recovered with sedation.  Was also hypoxemic with movement to gurney for transport.  Also had been transiently hypotensive with movement.   Received about 2L fluid on my review of chart. Foley in place with some slightly dark urine.    Past Medical History:  Hypothyroidism HLD Nephrolithiasis obesity  Hx of back surgery, breast biopsy, ankle surgery  Home meds: prednisone taper 9/21, vitamin D, zocor, levoxyl, prn advil and tylenol Significant Hospital Events:  Intubated 1/9 Proned 1/10  Consults:   Procedures:  Intubated 1/9 Central line R femoral 1/9  Significant Diagnostic Tests:  CXR 06/17/20 : Bilateral lung opacities with central air bronchograms 06/17/20: D-dimer 3.59, CRP 24, ferritin 938  Micro Data:   07/07/20: Covid + Blood culture Jul 07, 2020 >> no growth to date x2 days  Antimicrobials:  Remdesivir  Interim History / Subjective:  Repeat ABG overnight showed P/F ratio of 142, <150, proned. Patient tolerated well, had increase in  versed drip and fentanyl. Oxygenating well.  Patient proned, sedated and intubated. Appears comfortable on exam.   Objective   Blood pressure 127/81, pulse 72, temperature 98.9 F (37.2 C), temperature source Oral, resp. rate 20, height 5\' 7"  (1.702 m), weight 123.9 kg, SpO2 97 %.    Vent Mode: PRVC FiO2 (%):  [40 %-100 %] 40 % Set Rate:  [26 bmp-35 bmp] 35 bmp Vt Set:  [370 mL-450 mL] 370 mL PEEP:  [16 cmH20-20 cmH20] 20 cmH20 Plateau Pressure:  [33 cmH20-43 cmH20] 36 cmH20   Intake/Output Summary (Last 24 hours) at 06/19/2020 0651 Last data filed at 06/19/2020 0600 Gross per 24 hour  Intake 1419.22 ml  Output 630 ml  Net 789.22 ml   Filed Weights   July 07, 2020 1516 06/19/20 0405  Weight: 127 kg 123.9 kg    Examination: General: Proned, on fentanyl and versed drip, NAD HENT: NCAT, ETT in place Lungs: Decreased breath sounds, diffuse rhonchi, breathing comfortably, on ventilator Cardiovascular: RRR,, no murmurs, rubs or gallops   Abdomen: obese, no obvious tenderness,  Extremities:  No edema or erythema, cold extremities Neuro: Opens eyes to voice, not following commands   Resolved Hospital Problem list     Assessment & Plan:  Acute hypoxic and hypercarbic respiratory failure secondary to Covid pneumonia: Received Tocilizumab.  Currently on remdesivir and steroids. Intubated, on pressure support with FiO2 40%, PEEP 20, TV 370, oxygenating well.  FiO2 has been weaning down.  ABG overnight showed P/F ratio of 142, <150. Patient was proned and tolerated it well.  BP has been labile, down to 89/48 overnight, stable this morning around 120s/80s.  Patient has significantly elevated D dimer, likely 2/2 COVID however PE cannot be ruled out. Echocardiogram showed normal EF, no evidence of right heart strain. Doppler showed  Evidence of DVT, started on IV heparin.   -Continue remdesivir -Continue solumedrol 125 mg BID -Repeated tociluzimab x2 -Continue ventilator support, continue  low TV at 370. FiO2 has been weaned down, wean down on PEEP to 18 -Repeat CXR today -Repeat ABG later after supine for a few hours -Goal PH 7.3-7.45 -Peak pressure goal <30, driving pressure <28.  -Wean fentanyl as able -Wean versed as able  Acute DVT  D-dimer significantly elevated >20. BL LE doppler showed acute DVT in R femoral vein, R popliteal vein, R posterior tibial vein, and right gastrocnemius veins, and age indeterminate DVT in right common femoral vein and SF junction, no DVT in LLE. Started on IV heparin per pharmacy. Echocardiogram showed normal EF, no evidence of right heart strain.   -Continue IV heparin  AKI: Cr up to 1.16 yesterday, improved ot 1.09, BL around 0.8. Appears euvolemic on exam. Net + 2.8L since admission, consider trial of diuresis.   -BMP daily  Hyperglycemia: CBGs have been elevated.  -Start SSI  Hx hypothyroidism:  TSH was 0.36.  -Continue home levothyroxine 50 mcg daily  HLD: -Continue simvastatin  Hypernatremia: Na 147 today, on free water 200 q4 hours per tube. -Continue free water -Daily BMP  Best practice (evaluated daily)  Diet: Start tube feeds  Pain/Anxiety/Delirium protocol (if indicated): fentanyl, versed  VAP protocol (if indicated): yes DVT prophylaxis: heparin TID GI prophylaxis: protonix Glucose control: iss prn  Mobility: BR Disposition:ICU  Goals of Care:  Last date of multidisciplinary goals of care discussion: Family and staff present: None. Contacted daughter and daughter in law regarding patient status Summary of discussion:  Follow up goals of care discussion due:  Code Status: Full Code   Labs   CBC: Recent Labs  Lab 07/03/2020 1515 06/17/20 0556 06/18/20 0159 06/18/20 1336 06/18/20 2308  WBC 8.3 10.5 11.7*  --   --   NEUTROABS 6.8 8.9* 11.2*  --   --   HGB 13.6 13.0 12.3 12.9 11.9*  HCT 43.6 41.8 41.5 38.0 35.0*  MCV 94.4 94.6 97.2  --   --   PLT 339 331 304  --   --     Basic Metabolic  Panel: Recent Labs  Lab 07/05/2020 1515 06/17/20 0556 06/18/20 0159 06/18/20 0909 06/18/20 1336 06/18/20 1700 06/18/20 2308  NA 142 145 148*  --  148*  --  147*  K 3.9 4.2 5.2*  --  5.1  --  4.6  CL 109 116* 116*  --   --   --   --   CO2 20* 19* 22  --   --   --   --   GLUCOSE 102* 124* 148*  --   --   --   --   BUN 49* 31* 29*  --   --   --   --   CREATININE 1.70* 0.84 1.16*  --   --   --   --   CALCIUM 8.5* 8.3* 8.5*  --   --   --   --   MG  --  2.2 2.4 2.6*  --  2.7*  --   PHOS  --  2.0* 3.0 3.0  --  3.5  --    GFR: Estimated Creatinine  Clearance: 67.8 mL/min (A) (by C-G formula based on SCr of 1.16 mg/dL (H)). Recent Labs  Lab 07/02/2020 1515 06/09/2020 1702 06/17/20 0556 06/18/20 0159  PROCALCITON  --  0.69  --   --   WBC 8.3  --  10.5 11.7*    Liver Function Tests: Recent Labs  Lab 07/08/2020 1515 06/17/20 0556 06/18/20 0159  AST 69* 58* 55*  ALT 42 34 32  ALKPHOS 98 94 114  BILITOT 0.7 0.7 0.6  PROT 7.9 7.3 6.8  ALBUMIN 3.2* 2.9* 2.5*   No results for input(s): LIPASE, AMYLASE in the last 168 hours. No results for input(s): AMMONIA in the last 168 hours.  ABG    Component Value Date/Time   PHART 7.240 (L) 06/18/2020 2308   PCO2ART 48.1 (H) 06/18/2020 2308   PO2ART 71 (L) 06/18/2020 2308   HCO3 20.6 06/18/2020 2308   TCO2 22 06/18/2020 2308   ACIDBASEDEF 7.0 (H) 06/18/2020 2308   O2SAT 90.0 06/18/2020 2308     Coagulation Profile: No results for input(s): INR, PROTIME in the last 168 hours.  Cardiac Enzymes: No results for input(s): CKTOTAL, CKMB, CKMBINDEX, TROPONINI in the last 168 hours.  HbA1C: Hgb A1c MFr Bld  Date/Time Value Ref Range Status  06/18/2020 02:00 AM 6.0 (H) 4.8 - 5.6 % Final    Comment:    (NOTE) Pre diabetes:          5.7%-6.4%  Diabetes:              >6.4%  Glycemic control for   <7.0% adults with diabetes     CBG: Recent Labs  Lab 06/18/20 1124 06/18/20 1615 06/18/20 1944 06/18/20 2322 06/19/20 0338  GLUCAP  142* 158* 181* 205* 179*    Review of Systems:   Unable to assess  Past Medical History:  She,  has a past medical history of Hyperlipidemia, Hypothyroidism, Kidney stone, Morbid obesity (HCC) (01/02/2012), and Obesity.   Surgical History:   Past Surgical History:  Procedure Laterality Date  . BACK SURGERY  1992   no prolems with surgery  . BREAST BIOPSY    . cyst removed from throat  1980s  . ORIF ANKLE FRACTURE  01/02/2012   Procedure: OPEN REDUCTION INTERNAL FIXATION (ORIF) ANKLE FRACTURE;  Surgeon: Darreld McleanWayne Keeling, MD;  Location: [facility];  Service: Orthopedics;  Laterality: Left;     Social History:   reports that she has never smoked. She has never used smokeless tobacco. She reports that she does not drink alcohol and does not use drugs.   Family History:  Her family history includes Cancer in her brother; Coronary artery disease in her father and sister; Diabetes in her mother and sister; Gout in her father; Heart attack (age of onset: 2171) in her mother; Hypertension in her mother; Obesity in her sister; Stroke in her sister; Stroke (age of onset: 7965) in her brother. There is no history of Colon cancer, Esophageal cancer, Rectal cancer, or Stomach cancer.   Allergies No Known Allergies   Home Medications  Prior to Admission medications   Medication Sig Start Date End Date Taking? Authorizing Provider  acetaminophen (TYLENOL) 325 MG tablet Take 650 mg by mouth every 6 (six) hours as needed.   Yes [provider]  ibuprofen (ADVIL) 200 MG tablet Take 200 mg by mouth every 6 (six) hours as needed.   Yes [provider]  levothyroxine (SYNTHROID) 50 MCG tablet Take 1 tablet (50 mcg total) by mouth daily. 07/05/19 07/04/20 Yes  Junie Spencer, [place]   Claudean Severance, M.D. PGY3 Pager [phone] 06/19/2020 6:51 AM

## 2020-06-19 NOTE — Progress Notes (Signed)
RT note. Pt proned at this time. Able to pass cath down ETT, VS stable at this time, RT will continue to monitor.

## 2020-06-19 NOTE — Progress Notes (Signed)
RT NOTES: Assisted nursing with turning patient's head to the right without complications. ETT still secured with cloth tape. Will continue to monitor.

## 2020-06-19 NOTE — Progress Notes (Signed)
ANTICOAGULATION CONSULT NOTE   Pharmacy Consult for Heparin Indication: DVT   Labs: Recent Labs    06/17/20 0556 06/18/20 0159 06/18/20 1336 06/18/20 2308 06/19/20 0130 06/19/20 0627 06/19/20 1010  HGB 13.0 12.3 12.9 11.9*  --  12.5  --   HCT 41.8 41.5 38.0 35.0*  --  39.6  --   PLT 331 304  --   --   --  263  --   HEPARINUNFRC  --   --   --   --  0.75*  --  0.96*  CREATININE 0.84 1.16*  --   --   --  1.09*  --      Assessment: 43 YOF who presented on 1/8 with COVID PNA and now found to have a DVT. Pharmacy consulted to start Heparin for anticoagulation.   Initial heparin level 0.96 units/ml.  Level is above goal.  No issues with bleeding noted. CBC and PLT are stable.   Goal of Therapy:  Heparin level 0.3-0.7 units/ml Monitor platelets by anticoagulation protocol: Yes   Plan:  - Reduce Heparin to 1250 units/hr (12.5 ml/hr) - 6 hour heparin level - Daily HL/CBC - Will continue to monitor for any signs/symptoms of bleeding and will follow up with heparin level in 6 hours   Lamar Sprinkles, PharmD PGY1 Pharmacy Resident 06/19/2020 12:07 PM

## 2020-06-19 NOTE — Progress Notes (Signed)
eLink Physician-Brief Progress Note Patient Name: Courtney Simmons DOB: 10/23/1955 MRN: [id number]   Date of Service  06/19/2020  HPI/Events of Note  Ventilator asynchrony  Hypoxia - Sat = 84% with frequent ventilator asynchrony. Currently sedated with Fentanyl and Versed IV infusions. P/F ratio = 63/1.0 = 63. Patient will require pronation therapy as P/F ratio < 150.    eICU Interventions  Plan:  1. Vecuronium 10 mg IV Q 1 hour PRN ventilator asynchrony per PCCM  NMB protocol. 2. Prone per PCCM pronation protocol.       Intervention Category Major Interventions: Respiratory failure - evaluation and management;Hypoxemia - evaluation and management  Lenell Antu 06/19/2020, 10:05 PM

## 2020-06-19 NOTE — Progress Notes (Signed)
Assisted tele visit to patient with family members.  Thomas, Shloma Roggenkamp Renee, RN  

## 2020-06-19 NOTE — Progress Notes (Signed)
eLink Physician-Brief Progress Note Patient Name: Courtney Simmons DOB: 08-29-1955 MRN: [id number]   Date of Service  06/19/2020  HPI/Events of Note  P/F ratio = 71/0.50 = 142. Since P/F ratio is < 150.   eICU Interventions  Plan: 1. Prone per PCCM pronation order set.      Intervention Category Major Interventions: Hypoxemia - evaluation and management  Lenell Antu 06/19/2020, 12:33 AM

## 2020-06-19 NOTE — Progress Notes (Signed)
ANTICOAGULATION CONSULT NOTE   Pharmacy Consult for Heparin Indication: DVT   Labs: Recent Labs    06/26/2020 1515 06/17/20 0556 06/18/20 0159 06/18/20 1336 06/18/20 2308 06/19/20 0130  HGB 13.6 13.0 12.3 12.9 11.9*  --   HCT 43.6 41.8 41.5 38.0 35.0*  --   PLT 339 331 304  --   --   --   HEPARINUNFRC  --   --   --   --   --  0.75*  CREATININE 1.70* 0.84 1.16*  --   --   --      Assessment: 76 YOF who presented on 1/8 with COVID PNA and now found to have a DVT. Pharmacy consulted to start Heparin for anticoagulation.   The patient has been receiving Heparin SQ for DVT prophylaxis - last dose at 1430. Will only give 1/2 bolus with initiation due to this. CBC wnl, Hep wt: 92 kg.  Initial heparin level 0.75 units/ml.  Level is slightly above goal.  Level was drawn ~ 5.5 hours after start of infusion.  Goal of Therapy:  Heparin level 0.3-0.7 units/ml Monitor platelets by anticoagulation protocol: Yes   Plan:  - Continue Heparin at 1500 units/hr (15 ml/hr) - Daily HL/CBC - Will continue to monitor for any signs/symptoms of bleeding and will follow up with heparin level in 6 hours   Thanks for allowing pharmacy to be a part of this patient's care.  Talbert Cage, PharmD Clinical Pharmacist

## 2020-06-19 NOTE — Progress Notes (Signed)
RT NOTES: Assisted nursing with placing patient back in supine position. Commercial tube holder placed, tube secured. Will continue to monitor.

## 2020-06-19 NOTE — ED Provider Notes (Signed)
Pt had been awaiting an ICU bed at [facility].  She has worsening work of breathing and shortness of breath.  Dr. Gwenlyn Perking asked me to intubate patient and place a central line.  Pt intubated by PA Layden under my direct supervision.  The procedure was done under direct visualization with the glidescope. The balloon tore on the first attempt, so the tube was replaced over a bougie.  Procedure went well.  After the tube was placed, CXR confirmed placement.  A central line was placed in the right femoral vein by PA Layden and myself.  This line was placed without any complications.    Pt required versed and propofol for sedation after intubation.  If she was not completely sedated and trying to work at all, her O2 sat would drop quickly.    CRITICAL CARE Performed by: Jacalyn Lefevre   Total critical care time: 45 minutes  Critical care time was exclusive of separately billable procedures and treating other patients.  Critical care was necessary to treat or prevent imminent or life-threatening deterioration.  Critical care was time spent personally by me on the following activities: development of treatment plan with patient and/or surrogate as well as nursing, discussions with consultants, evaluation of patient's response to treatment, examination of patient, obtaining history from patient or surrogate, ordering and performing treatments and interventions, ordering and review of laboratory studies, ordering and review of radiographic studies, pulse oximetry and re-evaluation of patient's condition.   Jacalyn Lefevre, MD 06/19/20 1003

## 2020-06-19 NOTE — Progress Notes (Signed)
ANTICOAGULATION CONSULT NOTE   Pharmacy Consult for Heparin Indication: DVT   Labs: Recent Labs    06/17/20 0556 06/18/20 0159 06/18/20 1336 06/18/20 2308 06/19/20 0130 06/19/20 0627 06/19/20 1010 06/19/20 1845  HGB 13.0 12.3 12.9 11.9*  --  12.5  --   --   HCT 41.8 41.5 38.0 35.0*  --  39.6  --   --   PLT 331 304  --   --   --  263  --   --   HEPARINUNFRC  --   --   --   --  0.75*  --  0.96* 0.80*  CREATININE 0.84 1.16*  --   --   --  1.09*  --   --      Assessment: 62 YOF who presented on 1/8 with COVID PNA and now found to have a DVT. Pharmacy consulted to start Heparin for anticoagulation.   Heparin drip decreased this am to 1250 uts/hr running through central line.  HL 0.8 drawn correctly from peripheal stick by lab is still > goal.  No bleeding per RN. CBC and PLT are stable.   Goal of Therapy:  Heparin level 0.3-0.7 units/ml Monitor platelets by anticoagulation protocol: Yes   Plan:  - Reduce Heparin to 1000 units/hr - Daily HL/CBC - Will continue to monitor for any signs/symptoms of bleeding     Leota Sauers Pharm.D. CPP, BCPS Clinical Pharmacist [phone] 06/19/2020 8:07 PM

## 2020-06-20 ENCOUNTER — Inpatient Hospital Stay (HOSPITAL_COMMUNITY): Payer: BC Managed Care – PPO

## 2020-06-20 ENCOUNTER — Inpatient Hospital Stay: Payer: Self-pay

## 2020-06-20 DIAGNOSIS — J1282 Pneumonia due to coronavirus disease 2019: Secondary | ICD-10-CM | POA: Diagnosis not present

## 2020-06-20 DIAGNOSIS — U071 COVID-19: Secondary | ICD-10-CM | POA: Diagnosis not present

## 2020-06-20 LAB — CBC WITH DIFFERENTIAL/PLATELET
Abs Immature Granulocytes: 0.6 10*3/uL — ABNORMAL HIGH (ref 0.00–0.07)
Band Neutrophils: 5 %
Basophils Absolute: 0 10*3/uL (ref 0.0–0.1)
Basophils Relative: 0 %
Eosinophils Absolute: 0 10*3/uL (ref 0.0–0.5)
Eosinophils Relative: 0 %
HCT: 42.6 % (ref 36.0–46.0)
Hemoglobin: 12.6 g/dL (ref 12.0–15.0)
Lymphocytes Relative: 0 %
Lymphs Abs: 0 10*3/uL — ABNORMAL LOW (ref 0.7–4.0)
MCH: 29.3 pg (ref 26.0–34.0)
MCHC: 29.6 g/dL — ABNORMAL LOW (ref 30.0–36.0)
MCV: 99.1 fL (ref 80.0–100.0)
Monocytes Absolute: 0.3 10*3/uL (ref 0.1–1.0)
Monocytes Relative: 1 %
Myelocytes: 2 %
Neutro Abs: 29.4 10*3/uL — ABNORMAL HIGH (ref 1.7–7.7)
Neutrophils Relative %: 92 %
Platelets: 223 10*3/uL (ref 150–400)
RBC: 4.3 MIL/uL (ref 3.87–5.11)
RDW: 14.6 % (ref 11.5–15.5)
WBC: 30.3 10*3/uL — ABNORMAL HIGH (ref 4.0–10.5)
nRBC: 0.5 % — ABNORMAL HIGH (ref 0.0–0.2)
nRBC: 2 /100 WBC — ABNORMAL HIGH

## 2020-06-20 LAB — COMPREHENSIVE METABOLIC PANEL
ALT: 34 U/L (ref 0–44)
AST: 45 U/L — ABNORMAL HIGH (ref 15–41)
Albumin: 2.8 g/dL — ABNORMAL LOW (ref 3.5–5.0)
Alkaline Phosphatase: 114 U/L (ref 38–126)
Anion gap: 11 (ref 5–15)
BUN: 76 mg/dL — ABNORMAL HIGH (ref 8–23)
CO2: 19 mmol/L — ABNORMAL LOW (ref 22–32)
Calcium: 8.5 mg/dL — ABNORMAL LOW (ref 8.9–10.3)
Chloride: 114 mmol/L — ABNORMAL HIGH (ref 98–111)
Creatinine, Ser: 1.76 mg/dL — ABNORMAL HIGH (ref 0.44–1.00)
GFR, Estimated: 32 mL/min — ABNORMAL LOW (ref >60)
Glucose, Bld: 270 mg/dL — ABNORMAL HIGH (ref 70–99)
Potassium: 5.3 mmol/L — ABNORMAL HIGH (ref 3.5–5.1)
Sodium: 144 mmol/L (ref 135–145)
Total Bilirubin: 0.7 mg/dL (ref 0.3–1.2)
Total Protein: 6.2 g/dL — ABNORMAL LOW (ref 6.5–8.1)

## 2020-06-20 LAB — BASIC METABOLIC PANEL
Anion gap: 13 (ref 5–15)
Anion gap: 11 (ref 5–15)
BUN: 89 mg/dL — ABNORMAL HIGH (ref 8–23)
BUN: 95 mg/dL — ABNORMAL HIGH (ref 8–23)
CO2: 16 mmol/L — ABNORMAL LOW (ref 22–32)
CO2: 17 mmol/L — ABNORMAL LOW (ref 22–32)
Calcium: 8.2 mg/dL — ABNORMAL LOW (ref 8.9–10.3)
Calcium: 8.1 mg/dL — ABNORMAL LOW (ref 8.9–10.3)
Chloride: 114 mmol/L — ABNORMAL HIGH (ref 98–111)
Chloride: 115 mmol/L — ABNORMAL HIGH (ref 98–111)
Creatinine, Ser: 1.87 mg/dL — ABNORMAL HIGH (ref 0.44–1.00)
Creatinine, Ser: 1.8 mg/dL — ABNORMAL HIGH (ref 0.44–1.00)
GFR, Estimated: 30 mL/min — ABNORMAL LOW (ref >60)
GFR, Estimated: 31 mL/min — ABNORMAL LOW (ref >60)
Glucose, Bld: 326 mg/dL — ABNORMAL HIGH (ref 70–99)
Glucose, Bld: 330 mg/dL — ABNORMAL HIGH (ref 70–99)
Potassium: 5.7 mmol/L — ABNORMAL HIGH (ref 3.5–5.1)
Potassium: 5.6 mmol/L — ABNORMAL HIGH (ref 3.5–5.1)
Sodium: 143 mmol/L (ref 135–145)
Sodium: 143 mmol/L (ref 135–145)

## 2020-06-20 LAB — POCT I-STAT 7, (LYTES, BLD GAS, ICA,H+H)
Acid-base deficit: 7 mmol/L — ABNORMAL HIGH (ref 0.0–2.0)
Bicarbonate: 22.1 mmol/L (ref 20.0–28.0)
Calcium, Ion: 1.28 mmol/L (ref 1.15–1.40)
HCT: 35 % — ABNORMAL LOW (ref 36.0–46.0)
Hemoglobin: 11.9 g/dL — ABNORMAL LOW (ref 12.0–15.0)
O2 Saturation: 100 %
Patient temperature: 36.7
Potassium: 5.3 mmol/L — ABNORMAL HIGH (ref 3.5–5.1)
Sodium: 143 mmol/L (ref 135–145)
TCO2: 24 mmol/L (ref 22–32)
pCO2 arterial: 61.2 mmHg — ABNORMAL HIGH (ref 32.0–48.0)
pH, Arterial: 7.165 — CL (ref 7.350–7.450)
pO2, Arterial: 245 mmHg — ABNORMAL HIGH (ref 83.0–108.0)

## 2020-06-20 LAB — GLUCOSE, CAPILLARY
Glucose-Capillary: 255 mg/dL — ABNORMAL HIGH (ref 70–99)
Glucose-Capillary: 286 mg/dL — ABNORMAL HIGH (ref 70–99)
Glucose-Capillary: 286 mg/dL — ABNORMAL HIGH (ref 70–99)
Glucose-Capillary: 296 mg/dL — ABNORMAL HIGH (ref 70–99)
Glucose-Capillary: 300 mg/dL — ABNORMAL HIGH (ref 70–99)
Glucose-Capillary: 320 mg/dL — ABNORMAL HIGH (ref 70–99)

## 2020-06-20 LAB — PHOSPHORUS: Phosphorus: 3.8 mg/dL (ref 2.5–4.6)

## 2020-06-20 LAB — MAGNESIUM: Magnesium: 2.7 mg/dL — ABNORMAL HIGH (ref 1.7–2.4)

## 2020-06-20 LAB — HEPARIN LEVEL (UNFRACTIONATED): Heparin Unfractionated: 0.54 IU/mL (ref 0.30–0.70)

## 2020-06-20 LAB — FERRITIN: Ferritin: 812 ng/mL — ABNORMAL HIGH (ref 11–307)

## 2020-06-20 LAB — D-DIMER, QUANTITATIVE: D-Dimer, Quant: >20 ug/mL-FEU — ABNORMAL HIGH (ref 0.00–0.50)

## 2020-06-20 LAB — PROCALCITONIN: Procalcitonin: 6.18 ng/mL

## 2020-06-20 LAB — C-REACTIVE PROTEIN: CRP: 4.9 mg/dL — ABNORMAL HIGH (ref <1.0)

## 2020-06-20 MED ORDER — SODIUM CHLORIDE 0.9 % IV SOLN
2.0000 g | Freq: Two times a day (BID) | INTRAVENOUS | Status: DC
  Administered 2020-06-20 – 2020-06-24 (×8): 2 g via INTRAVENOUS
  Filled 2020-06-20 (×8): qty 2

## 2020-06-20 MED ORDER — VANCOMYCIN HCL 1750 MG/350ML IV SOLN: 1750.0000 mg | INTRAVENOUS | Status: DC

## 2020-06-20 MED ORDER — SENNA 8.6 MG PO TABS
1.0000 | ORAL_TABLET | Freq: Every day | ORAL | Status: AC
  Administered 2020-06-20: 8.6 mg
  Filled 2020-06-20: qty 1

## 2020-06-20 MED ORDER — INSULIN DETEMIR 100 UNIT/ML ~~LOC~~ SOLN
10.0000 [IU] | Freq: Every day | SUBCUTANEOUS | Status: DC
  Administered 2020-06-20: 10 [IU] via SUBCUTANEOUS
  Filled 2020-06-20: qty 0.1

## 2020-06-20 MED ORDER — VANCOMYCIN HCL 10 G IV SOLR
2500.0000 mg | Freq: Once | INTRAVENOUS | Status: AC
  Administered 2020-06-20: 2500 mg via INTRAVENOUS
  Filled 2020-06-20: qty 2500

## 2020-06-20 MED ORDER — INSULIN DETEMIR 100 UNIT/ML ~~LOC~~ SOLN
10.0000 [IU] | Freq: Two times a day (BID) | SUBCUTANEOUS | Status: DC
  Administered 2020-06-20 – 2020-06-21 (×2): 10 [IU] via SUBCUTANEOUS
  Filled 2020-06-20 (×3): qty 0.1

## 2020-06-20 MED ORDER — INSULIN ASPART 100 UNIT/ML ~~LOC~~ SOLN
5.0000 [IU] | Freq: Four times a day (QID) | SUBCUTANEOUS | Status: DC
  Administered 2020-06-20 – 2020-06-21 (×7): 5 [IU] via SUBCUTANEOUS

## 2020-06-20 MED ORDER — STERILE WATER FOR INJECTION IV SOLN
INTRAVENOUS | Status: DC
  Filled 2020-06-20 (×4): qty 850

## 2020-06-20 MED ORDER — INSULIN ASPART 100 UNIT/ML ~~LOC~~ SOLN
0.0000 [IU] | SUBCUTANEOUS | Status: DC
  Administered 2020-06-20 (×2): 11 [IU] via SUBCUTANEOUS
  Administered 2020-06-20: 15 [IU] via SUBCUTANEOUS
  Administered 2020-06-20: 11 [IU] via SUBCUTANEOUS
  Administered 2020-06-21 (×2): 7 [IU] via SUBCUTANEOUS
  Administered 2020-06-21: 11 [IU] via SUBCUTANEOUS
  Administered 2020-06-21: 7 [IU] via SUBCUTANEOUS
  Administered 2020-06-21 – 2020-06-22 (×5): 11 [IU] via SUBCUTANEOUS
  Administered 2020-06-22: 7 [IU] via SUBCUTANEOUS
  Administered 2020-06-22 – 2020-06-23 (×4): 11 [IU] via SUBCUTANEOUS
  Administered 2020-06-23 (×2): 7 [IU] via SUBCUTANEOUS
  Administered 2020-06-23: 15 [IU] via SUBCUTANEOUS
  Administered 2020-06-23: 11 [IU] via SUBCUTANEOUS
  Administered 2020-06-24: 7 [IU] via SUBCUTANEOUS
  Administered 2020-06-24: 11 [IU] via SUBCUTANEOUS
  Administered 2020-06-24: 7 [IU] via SUBCUTANEOUS
  Administered 2020-06-24: 11 [IU] via SUBCUTANEOUS
  Administered 2020-06-24 (×2): 4 [IU] via SUBCUTANEOUS
  Administered 2020-06-24: 15 [IU] via SUBCUTANEOUS
  Administered 2020-06-25: 7 [IU] via SUBCUTANEOUS
  Administered 2020-06-25: 3 [IU] via SUBCUTANEOUS
  Administered 2020-06-25: 7 [IU] via SUBCUTANEOUS
  Administered 2020-06-25 – 2020-06-27 (×6): 4 [IU] via SUBCUTANEOUS
  Administered 2020-06-27: 11 [IU] via SUBCUTANEOUS
  Administered 2020-06-27: 3 [IU] via SUBCUTANEOUS
  Administered 2020-06-27: 7 [IU] via SUBCUTANEOUS
  Administered 2020-06-27: 4 [IU] via SUBCUTANEOUS
  Administered 2020-06-27: 15 [IU] via SUBCUTANEOUS
  Administered 2020-06-27: 7 [IU] via SUBCUTANEOUS
  Administered 2020-06-28: 15 [IU] via SUBCUTANEOUS

## 2020-06-20 MED ORDER — SODIUM ZIRCONIUM CYCLOSILICATE 10 G PO PACK
10.0000 g | PACK | Freq: Every day | ORAL | Status: AC
  Administered 2020-06-20: 10 g via ORAL
  Filled 2020-06-20: qty 1

## 2020-06-20 NOTE — Progress Notes (Signed)
RT NOTE: RT and RN turned patient's head to the right and arms rotated with no complications. VSS. RT will continue to monitor.

## 2020-06-20 NOTE — Progress Notes (Signed)
RT NOTE: RT and RN turned patient's head to the left and rotated arms with no issues. Vitals are stable. RT will continue to monitor.

## 2020-06-20 NOTE — Progress Notes (Signed)
RT note. Attempted to place pt. Head RT to Lt, was not able to pass cather down ETT. Pt. Head turned back to previous RT side, RT will continue to monitor. VS stable

## 2020-06-20 NOTE — Progress Notes (Signed)
ANTICOAGULATION CONSULT NOTE   Pharmacy Consult for Heparin Indication: DVT   Labs: Recent Labs    06/18/20 0159 06/18/20 1336 06/19/20 0627 06/19/20 1010 06/19/20 1845 06/19/20 2019 06/20/20 0438  HGB 12.3   < > 12.5  --   --  12.2 12.6  HCT 41.5   < > 39.6  --   --  36.0 42.6  PLT 304  --  263  --   --   --  223  HEPARINUNFRC  --    < >  --  0.96* 0.80*  --  0.54  CREATININE 1.16*  --  1.09*  --   --   --  1.76*   < > = values in this interval not displayed.     Assessment: 71 YOF who presented on 1/8 with COVID PNA and now found to have a DVT. Pharmacy consulted to start Heparin for anticoagulation.   Heparin drip decreased to 1000 uts/hr running through central line.  HL theraputic at 0.54 drawn correctly from peripheal stick by lab.  No bleeding or infusion issues per RN. CBC and PLT are stable.   Goal of Therapy:  Heparin level 0.3-0.7 units/ml Monitor platelets by anticoagulation protocol: Yes   Plan:  - Continue Heparin to 1000 units/hr - Daily HL/CBC - Will continue to monitor for any signs/symptoms of bleeding     Lamar Sprinkles, PharmD PGY1 Pharmacy Resident 06/20/2020 10:42 AM

## 2020-06-20 NOTE — Progress Notes (Deleted)
Brief Cortrak Note  Consult received to place Cortrak. Discussed pt with RN who reports pt is currently in prone position and will be until 1800 tonight, which is past the time Cortrak service ends (1600). Discussed Cortrak service hours with RN. Pt currently with OGT and tolerating TF per RN. Plan to leave pt on list and have Cortrak placed on next available service day (Friday 06/22/20).   Eugene Gavia, MS, RD, LDN RD pager number and weekend/on-call pager number located in [place].

## 2020-06-20 NOTE — Progress Notes (Addendum)
Pharmacy Antibiotic Note  Courtney Simmons is a 65 y.o. female admitted on 06/27/2020 with COVID pneumonia.  Pt now with marked leukocytosis (30.3 K), Tmax 99.9 F. Pharmacy has been consulted for vancomycin and cefepime dosing for sepsis and pneumonia.  Scr 1.87, CrCl 42 ml/min (Scr up to 1.87 from 1.16 on admission)  Plan: Cefepime 2 gm IV Q 12 hrs Vancomycin 2500 mg IV X 1, followed by vancomycin 1750 mg IV Q 48 hrs (estimated vancomycin AUC on this regimen is 482.4, goal AUC is 400-550) Monitor WBC, temp, clinical improvement, cultures, renal function (will need to monitor renal function closely, given trend in Scr rising and adjust vancomycin regimen as needed), vancomycin levels  Height: 5\' 7"  (170.2 cm) Weight: 123.9 kg (273 lb 2.4 oz) IBW/kg (Calculated) : 61.6  Temp (24hrs), Avg:99.1 F (37.3 C), Min:98.78 F (37.1 C), Max:99.9 F (37.7 C)  Recent Labs  Lab 07/06/2020 1515 06/17/20 0556 06/18/20 0159 06/19/20 0627 06/20/20 0438 06/20/20 1316  WBC 8.3 10.5 11.7* 16.4* 30.3*  --   CREATININE 1.70* 0.84 1.16* 1.09* 1.76* 1.87*    Estimated Creatinine Clearance: 42 mL/min (A) (by C-G formula based on SCr of 1.87 mg/dL (H)).    No Known Allergies  Antimicrobials this admission: 1/9 remdesivir >> 1/12 vancomycin >> 1/12 cefepime  Microbiology results: 1/8 BCx X 2: NGTD 1/9 MRSA PCR: negative 1/8 COVID: positive 1/8 Flu A, flu B, HIV screen: negative  Thank you for allowing pharmacy to be a part of this patient's care.  3/8, PharmD, BCPS, [facility] Clinical Pharmacist 06/20/2020 3:04 PM

## 2020-06-20 NOTE — Procedures (Signed)
Cortrak  Person Inserting Tube:  Courtney Simmons E, RD Tube Type:  Cortrak - 43 inches Tube Location:  Left nare Initial Placement:  Stomach Secured by: Bridle Technique Used to Measure Tube Placement:  Documented cm marking at nare/ corner of mouth Cortrak Secured At:  75 cm    Cortrak Tube Team Note:  Consult received to place a Cortrak feeding tube.   No x-ray is required. RN may begin using tube.     If the tube becomes dislodged please keep the tube and contact the Cortrak team at [email or web address] (password [email or web address]) for replacement.  If after hours and replacement cannot be delayed, place a NG tube and confirm placement with an abdominal x-ray.    Courtney Cockerham, MS, RD, LDN RD pager number and weekend/on-call pager number located in [place].   

## 2020-06-20 NOTE — Progress Notes (Signed)
RT note. Pt. proned at this time, pt. Re-taped with cloth tape, able to pass cath down ETT. VS stable at this time, RT will continue to monitor.

## 2020-06-20 NOTE — Progress Notes (Signed)
Peripherally Inserted Central Catheter Placement  The IV Nurse has discussed with the patient and/or persons authorized to consent for the patient, the purpose of this procedure and the potential benefits and risks involved with this procedure.  The benefits include less needle sticks, lab draws from the catheter, and the patient may be discharged home with the catheter. Risks include, but not limited to, infection, bleeding, blood clot (thrombus formation), and puncture of an artery; nerve damage and irregular heartbeat and possibility to perform a PICC exchange if needed/ordered by physician.  Alternatives to this procedure were also discussed.  Bard Power PICC patient education guide, fact sheet on infection prevention and patient information card has been provided to patient /or left at bedside.  Telephone consent obtained from spouse and witnessed with Terie Purser, RN.  PICC Placement Documentation  PICC Triple Lumen 06/20/20 PICC Right Cephalic 39 cm 0 cm (Active)  Indication for Insertion or Continuance of Line Limited venous access - need for IV therapy >5 days (PICC only) 06/20/20 2154  Exposed Catheter (cm) 0 cm 06/20/20 2154  Site Assessment Clean;Dry;Intact 06/20/20 2154  Lumen #1 Status Capped (Central line);Flushed;Blood return noted 06/20/20 2154  Lumen #2 Status Capped (Central line);Flushed;Blood return noted 06/20/20 2154  Lumen #3 Status Capped (Central line);Flushed;Blood return noted 06/20/20 2154  Dressing Type Transparent;Occlusive;Securing device 06/20/20 2154  Dressing Status Clean;Dry;Intact 06/20/20 2154  Antimicrobial disc in place? Yes 06/20/20 2154  Safety Lock Not Applicable 06/20/20 2154  Line Care Connections checked and tightened 06/20/20 2154  Line Adjustment (NICU/IV Team Only) No 06/20/20 2154  Dressing Intervention New dressing 06/20/20 2154  Dressing Change Due 06/27/20 06/20/20 2154       Burnard Bunting Chenice 06/20/2020, 10:08 PM

## 2020-06-20 NOTE — Progress Notes (Signed)
RT NOTE: RN X 2 and RT X 2 placed patient in supine position with no complications. Patient tolerated well. Cloth tape removed and ETT holder placed- no skin breakdown noted at this time. VSS. RT will obtain ABG in 2 hours. RT will continue to monitor.

## 2020-06-20 NOTE — Progress Notes (Signed)
NAME:  Courtney Simmons, MRN:  [id number], DOB:  1956-01-02, LOS: 4 ADMISSION DATE:  06/11/2020, CONSULTATION DATE: 06/18/19 REFERRING MD:  Vanetta Mulders, CHIEF COMPLAINT:  Shortness of breath/respiratory failure.   Brief History:  65 yo woman with obesity hypothyroism, COVID PNA   Past Medical History:  Hypothyroidism HLD Nephrolithiasis obesity  Hx of back surgery, breast biopsy, ankle surgery  Home meds: prednisone taper 9/21, vitamin D, zocor, levoxyl, prn advil and tylenol Significant Hospital Events:  Intubated 1/9, Remdesivir and systemic steroids started. Also got Tocilizumab Proned 1/10, ddimer elevated. Got repeat Toci dose. LE [place] ordered. VT reduced to 6 cc/kg. + Acute RLE DVT involving femoral vein, popliteal, right posterior tib and gastrocnemius, fuill dose heparin started  1/11: getting NMB cont Prone positioning   Consults:   Procedures:  Intubated 1/9 Central line R femoral 1/9  Significant Diagnostic Tests:  CXR 06/17/20 : Bilateral lung opacities with central air bronchograms 06/17/20: D-dimer 3.59, CRP 24, ferritin 938  Micro Data:   07/01/2020: Covid + Blood culture 06/14/2020 >> no growth to date x2 days Respiratory culture 1/12>>>  Antimicrobials:  Remdesivir 1/9>>>  Interim History / Subjective:  Still w/ sig hypoxia   Objective   Blood pressure 112/69, pulse 98, temperature 98.78 F (37.1 C), resp. rate (Abnormal) 32, height 5\' 7"  (1.702 m), weight 123.9 kg, SpO2 100 %.    Vent Mode: PRVC FiO2 (%):  [40 %-100 %] 100 % Set Rate:  [35 bmp] 35 bmp Vt Set:  [370 mL] 370 mL PEEP:  [16 cmH20] 16 cmH20 Plateau Pressure:  [29 cmH20-34 cmH20] 30 cmH20   Intake/Output Summary (Last 24 hours) at 06/20/2020 1033 Last data filed at 06/20/2020 1030 Gross per 24 hour  Intake 1724.41 ml  Output 835 ml  Net 889.41 ml   Filed Weights   07/08/2020 1516 06/19/20 0405  Weight: 127 kg 123.9 kg    Examination:  General obese female remains heavily sedated  on high peep/fio2 support HENT orally intubated.  Pulm dec bilaterally currently on FIO2 100% peep 16 rr 35 Vt 370 pip 29 sats 100% Card rrr SR abd soft Ext warm LE edema Neuro heavily sedated. Fent and versed gtts BIS 48 gu cnc yellow   Resolved Hospital Problem list     Assessment & Plan:  Acute hypoxic and hypercarbic respiratory failure secondary to Covid pneumonia: -crp improving -pcxr from 1/11: diffuse bilateral airspace disease. Tubes and lines are in good position -+ 3.9 liters   Plan Cont low Vt ventilation @ 6cc/kg/pbw Day 3/5 Remdesivir  Day 3/5 high dose steroids w/ plan to begin taper at day 5 pao2 goals > 55 Sat goal >88 Titrate Peep/FIo2 accordingly  Cont prone protocol for P/F ratio < 150 w/ plan to repeat abg after she is back in supine position Driving pressure goal 3/11 goal <30 Cont heavy sedation w/ PRN NMB for vent synchrony  Am cxr VAP bundle Getting resp culture (see below)  Acute DVT (RLE) Plan Cont IV heparin   Marked leukocytosis ? Steroids Plan Trend cbc Ck PCT Send sputum Hold off on abx for now   AKI: Scr climbing.  Plan Strict I&O Renal dose meds Bid chem May end up need HD   Fluid and electrolyte imbalance: Hyperkalemia, hyperchloremia, NAG metabolic acidosis  ->hypernatremia improved w/ free water Plan Cont free water Hold lasix Repeat afternoon chem   Hyperglycemia: Still w/ sig hyperglycemia Plan Cont ssi (ensure resistant scale) Change levimir to 10 units bid  Hx hypothyroidism:  TSH was 0.36.  Plan Cont synthroid  -Continue home levothyroxine 50 mcg daily  HLD: Plan Cont statin   Best practice (evaluated daily)  Diet: tube feeds  Pain/Anxiety/Delirium protocol (if indicated): fentanyl, versed PRN NMB  VAP protocol (if indicated): yes DVT prophylaxis: heparin gtt started 1/10 GI prophylaxis: protonix Glucose control: SSI and levemir  Mobility: BR; prone position started  1/11 Disposition:ICU  Goals of Care:  Last date of multidisciplinary goals of care discussion: Family and staff present: None. Contacted daughter and daughter in law regarding patient status; will reach out 1/12 Summary of discussion:  Follow up goals of care discussion due:  Code Status: Full Code   My cct 4o min   ]Dariah Mcsorley E Karolyn Messing ACNP-BC [facility] Pulmonary/Critical Care Pager # [phone] OR # [phone] if no answer

## 2020-06-21 ENCOUNTER — Inpatient Hospital Stay (HOSPITAL_COMMUNITY): Payer: BC Managed Care – PPO

## 2020-06-21 DIAGNOSIS — J1282 Pneumonia due to coronavirus disease 2019: Secondary | ICD-10-CM | POA: Diagnosis not present

## 2020-06-21 DIAGNOSIS — U071 COVID-19: Secondary | ICD-10-CM | POA: Diagnosis not present

## 2020-06-21 LAB — CULTURE, BLOOD (ROUTINE X 2)
Culture: NO GROWTH
Culture: NO GROWTH
Special Requests: ADEQUATE

## 2020-06-21 LAB — COMPREHENSIVE METABOLIC PANEL
ALT: 35 U/L (ref 0–44)
AST: 31 U/L (ref 15–41)
Albumin: 2.3 g/dL — ABNORMAL LOW (ref 3.5–5.0)
Alkaline Phosphatase: 95 U/L (ref 38–126)
Anion gap: 9 (ref 5–15)
BUN: 95 mg/dL — ABNORMAL HIGH (ref 8–23)
CO2: 22 mmol/L (ref 22–32)
Calcium: 8 mg/dL — ABNORMAL LOW (ref 8.9–10.3)
Chloride: 114 mmol/L — ABNORMAL HIGH (ref 98–111)
Creatinine, Ser: 1.74 mg/dL — ABNORMAL HIGH (ref 0.44–1.00)
GFR, Estimated: 33 mL/min — ABNORMAL LOW (ref >60)
Glucose, Bld: 293 mg/dL — ABNORMAL HIGH (ref 70–99)
Potassium: 5.2 mmol/L — ABNORMAL HIGH (ref 3.5–5.1)
Sodium: 145 mmol/L (ref 135–145)
Total Bilirubin: 0.3 mg/dL (ref 0.3–1.2)
Total Protein: 5.3 g/dL — ABNORMAL LOW (ref 6.5–8.1)

## 2020-06-21 LAB — CBC WITH DIFFERENTIAL/PLATELET
Abs Immature Granulocytes: 0.44 10*3/uL — ABNORMAL HIGH (ref 0.00–0.07)
Basophils Absolute: 0.1 10*3/uL (ref 0.0–0.1)
Basophils Relative: 0 %
Eosinophils Absolute: 0 10*3/uL (ref 0.0–0.5)
Eosinophils Relative: 0 %
HCT: 33.9 % — ABNORMAL LOW (ref 36.0–46.0)
Hemoglobin: 10.6 g/dL — ABNORMAL LOW (ref 12.0–15.0)
Immature Granulocytes: 2 %
Lymphocytes Relative: 2 %
Lymphs Abs: 0.5 10*3/uL — ABNORMAL LOW (ref 0.7–4.0)
MCH: 30.1 pg (ref 26.0–34.0)
MCHC: 31.3 g/dL (ref 30.0–36.0)
MCV: 96.3 fL (ref 80.0–100.0)
Monocytes Absolute: 0.3 10*3/uL (ref 0.1–1.0)
Monocytes Relative: 2 %
Neutro Abs: 17.6 10*3/uL — ABNORMAL HIGH (ref 1.7–7.7)
Neutrophils Relative %: 94 %
Platelets: 151 10*3/uL (ref 150–400)
RBC: 3.52 MIL/uL — ABNORMAL LOW (ref 3.87–5.11)
RDW: 14.5 % (ref 11.5–15.5)
WBC Morphology: INCREASED
WBC: 18.8 10*3/uL — ABNORMAL HIGH (ref 4.0–10.5)
nRBC: 0.3 % — ABNORMAL HIGH (ref 0.0–0.2)

## 2020-06-21 LAB — POCT I-STAT 7, (LYTES, BLD GAS, ICA,H+H)
Acid-Base Excess: 0 mmol/L (ref 0.0–2.0)
Bicarbonate: 26.7 mmol/L (ref 20.0–28.0)
Calcium, Ion: 1.22 mmol/L (ref 1.15–1.40)
HCT: 29 % — ABNORMAL LOW (ref 36.0–46.0)
Hemoglobin: 9.9 g/dL — ABNORMAL LOW (ref 12.0–15.0)
O2 Saturation: 90 %
Patient temperature: 37.5
Potassium: 4.9 mmol/L (ref 3.5–5.1)
Sodium: 145 mmol/L (ref 135–145)
TCO2: 28 mmol/L (ref 22–32)
pCO2 arterial: 54 mmHg — ABNORMAL HIGH (ref 32.0–48.0)
pH, Arterial: 7.304 — ABNORMAL LOW (ref 7.350–7.450)
pO2, Arterial: 66 mmHg — ABNORMAL LOW (ref 83.0–108.0)

## 2020-06-21 LAB — BASIC METABOLIC PANEL
Anion gap: 9 (ref 5–15)
BUN: 105 mg/dL — ABNORMAL HIGH (ref 8–23)
CO2: 28 mmol/L (ref 22–32)
Calcium: 7.9 mg/dL — ABNORMAL LOW (ref 8.9–10.3)
Chloride: 109 mmol/L (ref 98–111)
Creatinine, Ser: 1.78 mg/dL — ABNORMAL HIGH (ref 0.44–1.00)
GFR, Estimated: 32 mL/min — ABNORMAL LOW (ref >60)
Glucose, Bld: 278 mg/dL — ABNORMAL HIGH (ref 70–99)
Potassium: 4.8 mmol/L (ref 3.5–5.1)
Sodium: 146 mmol/L — ABNORMAL HIGH (ref 135–145)

## 2020-06-21 LAB — D-DIMER, QUANTITATIVE: D-Dimer, Quant: 11.96 ug/mL-FEU — ABNORMAL HIGH (ref 0.00–0.50)

## 2020-06-21 LAB — CBC
HCT: 34.8 % — ABNORMAL LOW (ref 36.0–46.0)
Hemoglobin: 10.4 g/dL — ABNORMAL LOW (ref 12.0–15.0)
MCH: 29.1 pg (ref 26.0–34.0)
MCHC: 29.9 g/dL — ABNORMAL LOW (ref 30.0–36.0)
MCV: 97.5 fL (ref 80.0–100.0)
Platelets: 148 10*3/uL — ABNORMAL LOW (ref 150–400)
RBC: 3.57 MIL/uL — ABNORMAL LOW (ref 3.87–5.11)
RDW: 14.5 % (ref 11.5–15.5)
WBC: 20.3 10*3/uL — ABNORMAL HIGH (ref 4.0–10.5)
nRBC: 0.3 % — ABNORMAL HIGH (ref 0.0–0.2)

## 2020-06-21 LAB — MAGNESIUM: Magnesium: 3 mg/dL — ABNORMAL HIGH (ref 1.7–2.4)

## 2020-06-21 LAB — GLUCOSE, CAPILLARY
Glucose-Capillary: 265 mg/dL — ABNORMAL HIGH (ref 70–99)
Glucose-Capillary: 247 mg/dL — ABNORMAL HIGH (ref 70–99)
Glucose-Capillary: 256 mg/dL — ABNORMAL HIGH (ref 70–99)
Glucose-Capillary: 234 mg/dL — ABNORMAL HIGH (ref 70–99)
Glucose-Capillary: 223 mg/dL — ABNORMAL HIGH (ref 70–99)

## 2020-06-21 LAB — C-REACTIVE PROTEIN: CRP: 3.3 mg/dL — ABNORMAL HIGH (ref <1.0)

## 2020-06-21 LAB — FERRITIN: Ferritin: 665 ng/mL — ABNORMAL HIGH (ref 11–307)

## 2020-06-21 LAB — HEPARIN LEVEL (UNFRACTIONATED)
Heparin Unfractionated: 0.24 IU/mL — ABNORMAL LOW (ref 0.30–0.70)
Heparin Unfractionated: 0.3 IU/mL (ref 0.30–0.70)

## 2020-06-21 LAB — PHOSPHORUS: Phosphorus: 2.6 mg/dL (ref 2.5–4.6)

## 2020-06-21 LAB — PROCALCITONIN: Procalcitonin: 1.34 ng/mL

## 2020-06-21 MED ORDER — STERILE WATER FOR INJECTION IJ SOLN
INTRAMUSCULAR | Status: AC
  Filled 2020-06-21: qty 10

## 2020-06-21 MED ORDER — ALBUTEROL SULFATE (2.5 MG/3ML) 0.083% IN NEBU: 2.5000 mg | INHALATION_SOLUTION | RESPIRATORY_TRACT | Status: DC | PRN

## 2020-06-21 MED ORDER — FUROSEMIDE 10 MG/ML IJ SOLN
40.0000 mg | Freq: Two times a day (BID) | INTRAMUSCULAR | Status: AC
  Administered 2020-06-21 (×2): 40 mg via INTRAVENOUS
  Filled 2020-06-21 (×2): qty 4

## 2020-06-21 MED ORDER — INSULIN DETEMIR 100 UNIT/ML ~~LOC~~ SOLN
15.0000 [IU] | Freq: Two times a day (BID) | SUBCUTANEOUS | Status: DC
  Administered 2020-06-21 – 2020-06-22 (×2): 15 [IU] via SUBCUTANEOUS
  Filled 2020-06-21 (×3): qty 0.15

## 2020-06-21 MED ORDER — INSULIN ASPART 100 UNIT/ML ~~LOC~~ SOLN
5.0000 [IU] | SUBCUTANEOUS | Status: DC
  Administered 2020-06-22 – 2020-06-23 (×9): 5 [IU] via SUBCUTANEOUS

## 2020-06-21 MED ORDER — SODIUM ZIRCONIUM CYCLOSILICATE 10 G PO PACK
10.0000 g | PACK | Freq: Once | ORAL | Status: AC
  Administered 2020-06-21: 10 g
  Filled 2020-06-21: qty 1

## 2020-06-21 MED ORDER — PANTOPRAZOLE SODIUM 40 MG PO PACK
40.0000 mg | PACK | Freq: Every day | ORAL | Status: DC
  Administered 2020-06-21 – 2020-06-27 (×7): 40 mg
  Filled 2020-06-21 (×7): qty 20

## 2020-06-21 MED ORDER — SODIUM CHLORIDE 0.9 % IV SOLN: INTRAVENOUS | Status: DC

## 2020-06-21 NOTE — Progress Notes (Signed)
eLink Physician-Brief Progress Note Patient Name: Courtney Simmons DOB: Jun 30, 1955 MRN: [id number]   Date of Service  06/21/2020  HPI/Events of Note  Notified of hyperglycemia.  Pt has levemir 15 units BID and is tolerating tube feeds.  She has slding scale q4 and novolog 5 units but is schedules 4x daily.  eICU Interventions  Change frequency of novolog 5 units to q4 hours.     Intervention Category Minor Interventions: Other:  Larinda Buttery 06/21/2020, 9:58 PM

## 2020-06-21 NOTE — Progress Notes (Signed)
ANTICOAGULATION CONSULT NOTE  Pharmacy Consult:  Heparin Indication:  DVT  No Known Allergies  Patient Measurements: Height: 5\' 7"  (170.2 cm) Weight: 132 kg (291 lb 0.1 oz) IBW/kg (Calculated) : 61.6 Heparin Dosing Weight: 93 kg  Vital Signs: Temp: 98.78 F (37.1 C) (01/13 0600) Temp Source: Bladder (01/13 0400) BP: 94/64 (01/13 0600) Pulse Rate: 94 (01/13 0600)  Labs: Recent Labs    06/19/20 0627 06/19/20 1010 06/19/20 1845 06/19/20 2019 06/20/20 0438 06/20/20 1316 06/20/20 1707 06/20/20 1829 06/21/20 0432 06/21/20 0439  HGB 12.5  --   --    < > 12.6  --  11.9*  --   --  10.6*  HCT 39.6  --   --    < > 42.6  --  35.0*  --   --  33.9*  PLT 263  --   --   --  223  --   --   --   --  151  HEPARINUNFRC  --    < > 0.80*  --  0.54  --   --   --  0.24*  --   CREATININE 1.09*  --   --   --  1.76* 1.87*  --  1.80*  --  1.74*   < > = values in this interval not displayed.    Estimated Creatinine Clearance: 46.9 mL/min (A) (by C-G formula based on SCr of 1.74 mg/dL (H)).   Assessment: 89 YOF with Covid PNA and new DVT to continue on IV heparin.  Heparin level is slightly sub-therapeutic.  No issue with heparin infusion per RN.  RN also reports patient had a small bloody BM overnight with dry blood coming out from the back of his throat.  Hemoglobin/hematocrit and platelet count are trending down >> repeat CBC stable.  Goal of Therapy:  Heparin level 0.3-0.7 units/ml Monitor platelets by anticoagulation protocol: Yes   Plan:  Increase heparin gtt to 1150 units/hr Check 6 hr heparin level Daily heparin level and CBC Monitor closely for s/sx of bleeding  Soriyah Osberg D. 07-16-1982, PharmD, BCPS, BCCCP 06/21/2020, 10:43 AM

## 2020-06-21 NOTE — Progress Notes (Signed)
Pt proned @1800  per CCM orders. All vitals wnl.

## 2020-06-21 NOTE — Progress Notes (Signed)
ANTICOAGULATION CONSULT NOTE  Pharmacy Consult:  IV Heparin Indication:  DVT  No Known Allergies  Patient Measurements: Height: 5\' 7"  (170.2 cm) Weight: 132 kg (291 lb 0.1 oz) IBW/kg (Calculated) : 61.6 Heparin Dosing Weight: 93 kg  Vital Signs: Temp: 98.24 F (36.8 C) (01/13 2045) Temp Source: Bladder (01/13 2000) BP: 160/81 (01/13 2045) Pulse Rate: 99 (01/13 2045)  Labs: Recent Labs    06/20/20 0438 06/20/20 1316 06/20/20 1707 06/20/20 1829 06/21/20 0432 06/21/20 0439 06/21/20 0827 06/21/20 1559 06/21/20 1845  HGB 12.6  --    < >  --   --  10.6* 10.4* 9.9*  --   HCT 42.6  --    < >  --   --  33.9* 34.8* 29.0*  --   PLT 223  --   --   --   --  151 148*  --   --   HEPARINUNFRC 0.54  --   --   --  0.24*  --   --   --  0.30  CREATININE 1.76* 1.87*  --  1.80*  --  1.74*  --   --   --    < > = values in this interval not displayed.    Estimated Creatinine Clearance: 46.9 mL/min (A) (by C-G formula based on SCr of 1.74 mg/dL (H)).   Assessment: 65 yr old female with COVID pneumonia and new DVT and possible PE continuing on IV heparin.    Heparin level ~7 hrs after heparin infusion was increased to 1150 units/hr was 0.30 units/ml, which is at the low end of the goal range for this pt. H/H 9.9/29.0, plt 148. Per RN, no issues with IV; however, pt is having a small amount of bleeding from her mouth (not new, was also observed yesterday).  Goal of Therapy:  Heparin level 0.3-0.7 units/ml Monitor platelets by anticoagulation protocol: Yes   Plan:  Increase heparin infusion to 1200 units/hr Check 6-hr heparin level Monitor daily heparin level and CBC Monitor closely for s/sx of bleeding F/U transition to oral anticoagulant when able  05-19-1969, PharmD, BCPS, [facility] Clinical Pharmacist 06/21/2020, 9:14 PM

## 2020-06-21 NOTE — Progress Notes (Addendum)
NAME:  Courtney Simmons, MRN:  [id number], DOB:  05-06-1956, LOS: 5 ADMISSION DATE:  07-03-20, CONSULTATION DATE: 06/18/19 REFERRING MD:  Vanetta Mulders, CHIEF COMPLAINT:  Shortness of breath/respiratory failure.   Brief History:  65 yo woman with obesity hypothyroism, COVID PNA  Past Medical History:  Hypothyroidism HLD Nephrolithiasis obesity  Hx of back surgery, breast biopsy, ankle surgery  Home meds: prednisone taper 9/21, vitamin D, zocor, levoxyl, prn advil and tylenol Significant Hospital Events:  Intubated 1/9, Remdesivir and systemic steroids started. Also got Tocilizumab Proned 1/10, ddimer elevated. Got repeat Toci dose. LE [place] ordered. VT reduced to 6 cc/kg. + Acute RLE DVT involving femoral vein, popliteal, right posterior tib and gastrocnemius, fuill dose heparin started  1/11: getting NMB cont Prone positioning  1/13: Intermittent NMB, episode of hematochezia overnight per nurse report, remains on high vent settings  Consults:   Procedures:  Intubated 1/9 Central line R femoral 1/9  Significant Diagnostic Tests:  CXR 06/17/20 : Bilateral lung opacities with central air bronchograms 06/17/20: D-dimer 3.59, CRP 24, ferritin 938 06/21/20: CRP 3.3, D-dimer 11.9.  Chest x-ray showed stable bilateral opacities  Micro Data:  07/03/2020: Covid + Blood culture July 03, 2020 >> no growth to date x2 days Respiratory culture 1/12>>> moderate WBC, pending gram-positive rods, few gram-negative rods, few gram-negative cocci, culture reincubated   Antimicrobials:  Tocilizumab on 1/9 and 1/10 Remdesivir 1/9>>> Vancomycin 1/12>> Cefepime 1/12>>  Interim History / Subjective:  Patient supine, no acute events overnight.  Cortrack placed 1/12. Nurse noted episode of hematochezia overnight, nothing documented.   Objective   Blood pressure 94/64, pulse 94, temperature 98.78 F (37.1 C), resp. rate (!) 34, height 5\' 7"  (1.702 m), weight 132 kg, SpO2 94 %. CVP:  [31 mmHg-35 mmHg] 31  mmHg  Vent Mode: PRVC FiO2 (%):  [65 %-90 %] 65 % Set Rate:  [35 bmp] 35 bmp Vt Set:  [370 mL] 370 mL PEEP:  [16 cmH20] 16 cmH20 Plateau Pressure:  [25 cmH20-30 cmH20] 25 cmH20   Intake/Output Summary (Last 24 hours) at 06/21/2020 0742 Last data filed at 06/21/2020 0600 Gross per 24 hour  Intake 2284.68 ml  Output 980 ml  Net 1304.68 ml   Filed Weights   07/03/2020 1516 06/19/20 0405 06/21/20 0434  Weight: 127 kg 123.9 kg 132 kg    Examination:  General: Critically ill female, NAD, laying in bed, intubated HEENT: ETT in place, no masses, no thyromegaly, , sclera anicteric Cardiac: Tachycardic, normal rhythm, no m/r/g Pulmonary: CTABL, normal respiratory effort Abdomen: Soft, non-tender, nondistended, normoactive BS Extremity: No LE swelling Neuro: Sedated, unresponsive, on fentanyl and versed  Resolved Hospital Problem list     Assessment & Plan:  Acute hypoxic and hypercarbic respiratory failure secondary to Covid pneumonia: Status post Tocilizumab, currently on remdesivir and steroids. Intubated, supine, on pressure support with tidal volume 370, PEEP 16, FiO2 of 65%. Currently on IV heparin for DVT. Chest x-ray-1/11 showed diffuse bilateral airspace disease, repeat on 1/13 showed stable diffuse bilateral airspace disease. Inflammatory markers improving.  -Continue low Vt ventilation @ 6cc/kg/pbw -Day 4/5 Remdesivir  -Day 4/5 high dose steroids w/ plan to begin taper at day 5 -pao2 goals > 55 -Sat goal >88 -Titrate Peep/FIo2 accordingly  -Cont prone protocol for P/F ratio < 150 w/ plan to repeat abg after she is back in supine position -Driving pressure goal 2/13 goal <30 -Cont heavy sedation w/ PRN NMB for vent synchrony  -VAP bundle -Follow-up resp culture (see below)  Acute  DVT (RLE) Plan Cont IV heparin   Hematochezia: Nurse reports that this was noted by nursing overnight, small amount of blood noted in stools. Hgb did have a small drop, from 11.9 >  10.6. Vitals have remained stable. No bleeding noted this morning or on exam. IV heparin is currently subtherapeutic. Repeat CBC showed stable Hgb at 10.4.   -Continue to monitor for now -Daily CBC -Continue PPI  Marked leukocytosis WBC up to 30 yesterday, improved to 18.8 today.  Procalcitonin 6.18 > 1.34.  Respiratory culture, pending, shows moderate white blood cell, few, negative rods, few gram-positive cocci.  Procalcitonin and WBC improving. Has remained afebrile.  Received vancomycin and cefepime on 1/12.   Trend cbc Continue Vanco and cefepime for now Follow-up sputum culture  AKI: Creatinine stable from yesterday, still elevated from baseline, at 1.74 today.  Potassium 5.2.  Output of 650 yesterday, net +1.5 L.  Net +5 L since admission.  Strict I&O Renal dose meds Bid chem May end up need HD   Fluid and electrolyte imbalance: Hyperkalemia, hyperchloremia, NAG metabolic acidosis  Sodium 145 this morning, currently on free water.  Cont free water Hold lasix Repeat afternoon chem  Hyperglycemia: Continues to have significantly elevated CBGs, up to 265-300s.   Continue ssi (ensure resistant scale) Increase levimir to 15 units bid  Hx hypothyroidism:  TSH was 0.36.   -Continue home levothyroxine 50 mcg daily  HLD: Continue statin  Best practice (evaluated daily)  Diet: tube feeds  Pain/Anxiety/Delirium protocol (if indicated): fentanyl, versed PRN NMB  VAP protocol (if indicated): yes DVT prophylaxis: heparin gtt started 1/10 GI prophylaxis: protonix Glucose control: SSI and levemir  Mobility: BR, currently supine Disposition:ICU  Goals of Care:  Last date of multidisciplinary goals of care discussion: Family and staff present: None. Will contact daughter.  Summary of discussion:  Follow up goals of care discussion due:  Code Status: Full Code   Claudean Severance, M.D. PGY3 Pager [phone] 06/21/2020 7:56 AM

## 2020-06-21 NOTE — Progress Notes (Signed)
Spoke w/ pts dtr-in-law to provide updates. Spoke w/ e-link to set up time for visit.

## 2020-06-21 NOTE — Progress Notes (Signed)
Pt proned at 1800 with no complications. Et secured in proper position with cloth tape per protocol. Head of bed slightly elevated

## 2020-06-21 NOTE — Progress Notes (Signed)
Video call with family 

## 2020-06-22 ENCOUNTER — Inpatient Hospital Stay (HOSPITAL_COMMUNITY): Payer: BC Managed Care – PPO

## 2020-06-22 DIAGNOSIS — U071 COVID-19: Secondary | ICD-10-CM | POA: Diagnosis not present

## 2020-06-22 DIAGNOSIS — J1282 Pneumonia due to coronavirus disease 2019: Secondary | ICD-10-CM | POA: Diagnosis not present

## 2020-06-22 DIAGNOSIS — J9601 Acute respiratory failure with hypoxia: Secondary | ICD-10-CM | POA: Diagnosis not present

## 2020-06-22 LAB — CBC
HCT: 34.6 % — ABNORMAL LOW (ref 36.0–46.0)
Hemoglobin: 10.9 g/dL — ABNORMAL LOW (ref 12.0–15.0)
MCH: 30.2 pg (ref 26.0–34.0)
MCHC: 31.5 g/dL (ref 30.0–36.0)
MCV: 95.8 fL (ref 80.0–100.0)
Platelets: 144 10*3/uL — ABNORMAL LOW (ref 150–400)
RBC: 3.61 MIL/uL — ABNORMAL LOW (ref 3.87–5.11)
RDW: 14.3 % (ref 11.5–15.5)
WBC: 16.4 10*3/uL — ABNORMAL HIGH (ref 4.0–10.5)
nRBC: 0.2 % (ref 0.0–0.2)

## 2020-06-22 LAB — GLUCOSE, CAPILLARY
Glucose-Capillary: 242 mg/dL — ABNORMAL HIGH (ref 70–99)
Glucose-Capillary: 251 mg/dL — ABNORMAL HIGH (ref 70–99)
Glucose-Capillary: 287 mg/dL — ABNORMAL HIGH (ref 70–99)
Glucose-Capillary: 290 mg/dL — ABNORMAL HIGH (ref 70–99)
Glucose-Capillary: 293 mg/dL — ABNORMAL HIGH (ref 70–99)
Glucose-Capillary: 294 mg/dL — ABNORMAL HIGH (ref 70–99)
Glucose-Capillary: 297 mg/dL — ABNORMAL HIGH (ref 70–99)

## 2020-06-22 LAB — POCT I-STAT 7, (LYTES, BLD GAS, ICA,H+H)
Acid-Base Excess: 3 mmol/L — ABNORMAL HIGH (ref 0.0–2.0)
Bicarbonate: 33.1 mmol/L — ABNORMAL HIGH (ref 20.0–28.0)
Calcium, Ion: 1.23 mmol/L (ref 1.15–1.40)
HCT: 36 % (ref 36.0–46.0)
Hemoglobin: 12.2 g/dL (ref 12.0–15.0)
O2 Saturation: 93 %
Potassium: 4.7 mmol/L (ref 3.5–5.1)
Sodium: 144 mmol/L (ref 135–145)
TCO2: 36 mmol/L — ABNORMAL HIGH (ref 22–32)
pCO2 arterial: 84.1 mmHg (ref 32.0–48.0)
pH, Arterial: 7.204 — ABNORMAL LOW (ref 7.350–7.450)
pO2, Arterial: 87 mmHg (ref 83.0–108.0)

## 2020-06-22 LAB — MRSA PCR SCREENING: MRSA by PCR: NEGATIVE

## 2020-06-22 LAB — BASIC METABOLIC PANEL
Anion gap: 9 (ref 5–15)
BUN: 107 mg/dL — ABNORMAL HIGH (ref 8–23)
CO2: 28 mmol/L (ref 22–32)
Calcium: 7.9 mg/dL — ABNORMAL LOW (ref 8.9–10.3)
Chloride: 109 mmol/L (ref 98–111)
Creatinine, Ser: 1.66 mg/dL — ABNORMAL HIGH (ref 0.44–1.00)
GFR, Estimated: 34 mL/min — ABNORMAL LOW (ref >60)
Glucose, Bld: 316 mg/dL — ABNORMAL HIGH (ref 70–99)
Potassium: 4.8 mmol/L (ref 3.5–5.1)
Sodium: 146 mmol/L — ABNORMAL HIGH (ref 135–145)

## 2020-06-22 LAB — C-REACTIVE PROTEIN: CRP: 1.5 mg/dL — ABNORMAL HIGH (ref <1.0)

## 2020-06-22 LAB — CULTURE, RESPIRATORY W GRAM STAIN: Culture: NORMAL

## 2020-06-22 LAB — HEPARIN LEVEL (UNFRACTIONATED): Heparin Unfractionated: 0.36 IU/mL (ref 0.30–0.70)

## 2020-06-22 LAB — PROCALCITONIN: Procalcitonin: 0.49 ng/mL

## 2020-06-22 MED ORDER — INSULIN DETEMIR 100 UNIT/ML ~~LOC~~ SOLN
20.0000 [IU] | Freq: Two times a day (BID) | SUBCUTANEOUS | Status: DC
  Administered 2020-06-22 – 2020-06-23 (×2): 20 [IU] via SUBCUTANEOUS
  Filled 2020-06-22 (×3): qty 0.2

## 2020-06-22 MED ORDER — STERILE WATER FOR INJECTION IJ SOLN
INTRAMUSCULAR | Status: AC
  Filled 2020-06-22: qty 10

## 2020-06-22 MED ORDER — METHYLPREDNISOLONE SODIUM SUCC 125 MG IJ SOLR
60.0000 mg | INTRAMUSCULAR | Status: DC
  Administered 2020-06-23: 60 mg via INTRAVENOUS
  Filled 2020-06-22: qty 2

## 2020-06-22 MED ORDER — METOPROLOL TARTRATE 5 MG/5ML IV SOLN
5.0000 mg | INTRAVENOUS | Status: DC | PRN
  Administered 2020-06-22: 5 mg via INTRAVENOUS
  Filled 2020-06-22: qty 5

## 2020-06-22 MED ORDER — FUROSEMIDE 10 MG/ML IJ SOLN
40.0000 mg | Freq: Two times a day (BID) | INTRAMUSCULAR | Status: AC
  Administered 2020-06-22 (×2): 40 mg via INTRAVENOUS
  Filled 2020-06-22 (×2): qty 4

## 2020-06-22 NOTE — Progress Notes (Addendum)
ANTICOAGULATION CONSULT NOTE  Pharmacy Consult:  Heparin Indication:  DVT  No Known Allergies  Patient Measurements: Height: 5\' 7"  (170.2 cm) Weight: 132.7 kg (292 lb 8.8 oz) IBW/kg (Calculated) : 61.6 Heparin Dosing Weight: 93 kg  Vital Signs: Temp: 98.42 F (36.9 C) (01/14 1000) Temp Source: Bladder (01/14 0000) BP: 108/59 (01/14 1000) Pulse Rate: 88 (01/14 1000)  Labs: Recent Labs    06/21/20 0432 06/21/20 0439 06/21/20 0827 06/21/20 1559 06/21/20 1845 06/21/20 2224 06/22/20 0315  HGB  --  10.6* 10.4* 9.9*  --   --  10.9*  HCT  --  33.9* 34.8* 29.0*  --   --  34.6*  PLT  --  151 148*  --   --   --  144*  HEPARINUNFRC 0.24*  --   --   --  0.30  --  0.36  CREATININE  --  1.74*  --   --   --  1.78* 1.66*    Estimated Creatinine Clearance: 49.3 mL/min (A) (by C-G formula based on SCr of 1.66 mg/dL (H)).   Assessment: 31 YOF with Covid PNA and new DVT to continue on IV heparin.  Heparin level is therapeutic.  No issue with heparin infusion per RN.  RN reports some dry blood coming out from the back of her throat, and that it does not look fresh. No other overt bleeding reported. H/H is trending up, but platelets have fallen from 300s on admit to 140s today, calculated 4T score is 5-6 indicating intermediate to high chance of HITT, with the possibility of new DVT. MD contacted and decision was made to order HIT antibody while continuing heparin.   Goal of Therapy:  Heparin level 0.3-0.7 units/ml Monitor platelets by anticoagulation protocol: Yes   Plan:  Continue heparin gtt at 1200 units/hr Daily heparin level and CBC Monitor closely for s/sx of bleeding Follow HIT antibody and need to transition to argatroban.   65, PharmD PGY1 Pharmacy Resident 06/22/2020 10:25 AM

## 2020-06-22 NOTE — Progress Notes (Signed)
Pt went into sustained afib w/ RVR in 150s ~1850, Sedation increased, fentanyl and versed boluses given w/ no improvement. Spoke w. CCM MD to relay.  Received verbal order for 5mg  metoprolol. Pt now rate controlled, vitals stable.

## 2020-06-22 NOTE — Progress Notes (Signed)
Pt supined at this time with no complications. Et secured at 21 at the lip, as previously ordered by MD, with commercial tube holder. Pt with copious amounts of hemoptysis from nose while proned but stopped when pt supined.

## 2020-06-22 NOTE — Progress Notes (Addendum)
NAME:  Courtney Simmons, MRN:  [id number], DOB:  12/02/1955, LOS: 6 ADMISSION DATE:  06/26/20, CONSULTATION DATE: 06/18/19 REFERRING MD:  Vanetta Mulders, CHIEF COMPLAINT:  Shortness of breath/respiratory failure.   Brief History:  65 yo woman with obesity hypothyroism, COVID PNA  Past Medical History:  Hypothyroidism HLD Nephrolithiasis obesity  Hx of back surgery, breast biopsy, ankle surgery  Home meds: prednisone taper 9/21, vitamin D, zocor, levoxyl, prn advil and tylenol Significant Hospital Events:  Intubated 1/9, Remdesivir and systemic steroids started. Also got Tocilizumab Proned 1/10, ddimer elevated. Got repeat Toci dose. LE [place] ordered. VT reduced to 6 cc/kg. + Acute RLE DVT involving femoral vein, popliteal, right posterior tib and gastrocnemius, fuill dose heparin started  1/11: getting NMB cont Prone positioning  1/13: Intermittent NMB, episode of hematochezia overnight per nurse report, remains on high vent settings 1/14: Proned overnight. Went into atrial fibrillation, rate controlled.   Consults:   Procedures:  Intubated 1/9 Central line R femoral 1/9  Significant Diagnostic Tests:  CXR 06/17/20 : Bilateral lung opacities with central air bronchograms 06/17/20: D-dimer 3.59, CRP 24, ferritin 938 06/21/20: CRP 3.3, D-dimer 11.9.  Chest x-ray showed stable bilateral opacities  Micro Data:  Jun 26, 2020: Covid + Blood culture 06-26-2020 >> no growth to date x2 days Respiratory culture 1/12>>> moderate WBC, pending gram-positive rods, few gram-negative rods, few gram-negative cocci, culture reincubated   Antimicrobials:  Tocilizumab on 1/9 and 1/10 Remdesivir 1/9>>> 1/14 Vancomycin 1/12>> Cefepime 1/12>>   Interim History / Subjective:  Patient was proned overnight, received a dose of verocuronium.  Went into an irregular rhythm, likely atrial fibrillation, overnight and has remained in this rhythm.  Patient is currently proned and sedated.  Appears  comfortable.  Objective   Blood pressure 138/90, pulse 96, temperature 98.24 F (36.8 C), resp. rate (!) 35, height 5\' 7"  (1.702 m), weight 132.7 kg, SpO2 98 %.    Vent Mode: PRVC FiO2 (%):  [60 %-70 %] 70 % Set Rate:  [35 bmp] 35 bmp Vt Set:  [370 mL] 370 mL PEEP:  [16 cmH20] 16 cmH20 Plateau Pressure:  [30 cmH20-33 cmH20] 31 cmH20   Intake/Output Summary (Last 24 hours) at 06/22/2020 0718 Last data filed at 06/22/2020 0600 Gross per 24 hour  Intake 5986.86 ml  Output 2575 ml  Net 3411.86 ml   Filed Weights   06/19/20 0405 06/21/20 0434 06/22/20 0500  Weight: 123.9 kg 132 kg 132.7 kg    Examination:  General: Proned, critically ill-appearing female, no acute distress, lying in bed, intubated  HEENT: ETT in place, no masses, no thyromegaly, , sclera anicteric Cardiac: Tachycardic, irregular rhythm, no m/r/g Pulmonary: CTABL, normal respiratory effort Abdomen: Soft, non-tender, nondistended, normoactive BS Extremity: No LE swelling Neuro: Sedated, unresponsive, on fentanyl and versed  Resolved Hospital Problem list     Assessment & Plan:  Acute hypoxic and hypercarbic respiratory failure secondary to Covid pneumonia: Status post Tocilizumab, currently on remdesivir and steroids. Intubated, supine, on pressure support with tidal volume 370, PEEP 16, FiO2 of 60%. Currently on IV heparin for DVT and possible PE. Chest x-ray-1/11 showed diffuse bilateral airspace disease, repeat on 1/13 showed stable diffuse bilateral airspace disease. Inflammatory markers improving. Proned on 1/13, will be supine later this afternoon, will repeat ABG after this to see if proning needs to be repeated. Received verocuronium prior to proning.   -Continue low Vt ventilation @ 6cc/kg/pbw -Day 5/5 Remdesivir  -Day 5/5 high dose steroids w/ plan to begin taper today,  decrease to 60 mg IB BID -pao2 goals > 55 -Sat goal >88 -Titrate Peep/FIo2 accordingly  -Cont prone protocol for P/F ratio < 150  w/ plan to repeat abg after she is back in supine position -Driving pressure goal <84/ONGE goal <30 -Cont heavy sedation w/ PRN NMB for vent synchrony  -VAP bundle -Follow-up resp culture (see below)  Acute DVT (RLE) Plan Cont IV heparin   New onset atrial fibrillation: Noted overnight following proning, currently rate controlled.  Likely situational due to acute infection.  We will continue to monitor.  -Obtain EKG -Continue telemetry -On heparin for DVT  Hematochezia: Noted on night of 1/12, minimal blood in stools. Vitals have remained stable.  Hemoglobin has been stable. Remains on heparin for DVT  -Continue to monitor for now -Daily CBC -Continue PPI  Marked leukocytosis WBC improving, has remained afebrile.  Respiratory culture on 1/12 pending, shows moderate white blood cell, few, negative rods, few gram-positive cocci.  Procalcitonin and WBC improving. Has remained afebrile.   -Trend cbc -Continue Vanco and cefepime, complete 5 day course -Follow-up sputum culture  AKI: Creatinine mildly improved, 1.66 today.  Potassium 4.8.  Received IV Lasix 40 mg twice daily yesterday. Output of 2.5 L yesterday, net +2 L.  Net +7 L since admission. Currently on bicarb drip, bicarb today 28.   -Strict I&O -Renal dose meds -Monitor BMP  Fluid and electrolyte imbalance: Hyperkalemia, hyperchloremia, NAG metabolic acidosis  Sodium 146 this morning, currently on free water. On Bicarb drip.   -Cont free water -Discontinue bicarb drip -Daily BMP  Hyperglycemia: Continues to have significantly elevated CBGs, up to 265-300s. Received extra dose of long acting insulin overnight.   -Continue SSI resistant -Increase levimir to 20 units bid  Hx hypothyroidism:  TSH was 0.36.   -Continue home levothyroxine 50 mcg daily  HLD: -Continue statin  Best practice (evaluated daily)  Diet: tube feeds  Pain/Anxiety/Delirium protocol (if indicated): fentanyl, versed PRN NMB  VAP  protocol (if indicated): yes DVT prophylaxis: heparin gtt started 1/10 GI prophylaxis: protonix Glucose control: SSI and levemir  Mobility: BR, currently proned Disposition:ICU  Goals of Care:  Last date of multidisciplinary goals of care discussion: Family and staff present: None. Will contact daughter.  Summary of discussion:  Follow up goals of care discussion due:  Code Status: Full Code   Claudean Severance, M.D. PGY3 Pager [phone] 06/22/2020 7:18 AM  Pulmonary critical care attending:  This is a 65 year old morbidly obese female, history of hypothyroidism hyperlipidemia admitted for COVID pneumonia, intubated on 06/17/2020.  Patient has received course of remdesivir.  Patient remains critically ill.  Was proned and plans for an proning later this morning.  BP 125/71   Pulse (!) 109   Temp 98.24 F (36.8 C)   Resp (!) 35   Ht 5\' 7"  (1.702 m)   Wt 132.7 kg   SpO2 (!) 89%   BMI 45.82 kg/m   General: Obese female intubated on mechanical life support prone HEENT: Endotracheal tube in place Heart: Regular rate rhythm S1-S2, sinus on telemetry Lungs bilateral mechanically ventilated breath sounds Abdomen: Patient prone Extremities: Trace edema  Labs: Reviewed  Assessment: Acute hypoxemic hypercarbic respiratory failure secondary to COVID-19 pneumonia status post tocilizumab, remdesivir, now with tapering dose of steroids.  Acute DVT, presumed PE New onset atrial fibrillation. Leukocytosis, concern for HCAP, elevated procalcitonin AKI  Plan: Continue ARDS protocol Continue proning as tolerated. Wean FiO2 and PEEP as tolerated Continue heparin MRSA swab negative, DC vancomycin Continue  cefepime complete 5-day course Continue to taper steroids Continue to follow urine output and metabolic panel. Will likely need to consider reproning approximately 8 hours.  This patient is critically ill with multiple organ system failure; which, requires frequent high  complexity decision making, assessment, support, evaluation, and titration of therapies. This was completed through the application of advanced monitoring technologies and extensive interpretation of multiple databases. During this encounter critical care time was devoted to patient care services described in this note for 33 minutes.  Josephine Igo, DO [facility] 06/22/2020 1:27 PM

## 2020-06-22 NOTE — Progress Notes (Signed)
Assisted tele visit to patient with family member.  Courtney Simmons D Courtney Wyss, RN   

## 2020-06-22 NOTE — Progress Notes (Signed)
Pt unproned/turned back to supine at 1115. All vitals stable.

## 2020-06-22 NOTE — Progress Notes (Signed)
Spoke w/ pts dtr-in-law French Ana to provide updates. French Ana appreciative.

## 2020-06-23 ENCOUNTER — Inpatient Hospital Stay (HOSPITAL_COMMUNITY): Payer: BC Managed Care – PPO

## 2020-06-23 DIAGNOSIS — J1282 Pneumonia due to coronavirus disease 2019: Secondary | ICD-10-CM | POA: Diagnosis not present

## 2020-06-23 DIAGNOSIS — J80 Acute respiratory distress syndrome: Secondary | ICD-10-CM

## 2020-06-23 DIAGNOSIS — U071 COVID-19: Secondary | ICD-10-CM | POA: Diagnosis not present

## 2020-06-23 LAB — CBC
HCT: 35.8 % — ABNORMAL LOW (ref 36.0–46.0)
Hemoglobin: 11.3 g/dL — ABNORMAL LOW (ref 12.0–15.0)
MCH: 30.1 pg (ref 26.0–34.0)
MCHC: 31.6 g/dL (ref 30.0–36.0)
MCV: 95.5 fL (ref 80.0–100.0)
Platelets: 162 10*3/uL (ref 150–400)
RBC: 3.75 MIL/uL — ABNORMAL LOW (ref 3.87–5.11)
RDW: 14 % (ref 11.5–15.5)
WBC: 17.7 10*3/uL — ABNORMAL HIGH (ref 4.0–10.5)
nRBC: 0.2 % (ref 0.0–0.2)

## 2020-06-23 LAB — BASIC METABOLIC PANEL
Anion gap: 14 (ref 5–15)
BUN: 108 mg/dL — ABNORMAL HIGH (ref 8–23)
CO2: 28 mmol/L (ref 22–32)
Calcium: 7.6 mg/dL — ABNORMAL LOW (ref 8.9–10.3)
Chloride: 103 mmol/L (ref 98–111)
Creatinine, Ser: 1.43 mg/dL — ABNORMAL HIGH (ref 0.44–1.00)
GFR, Estimated: 41 mL/min — ABNORMAL LOW (ref >60)
Glucose, Bld: 351 mg/dL — ABNORMAL HIGH (ref 70–99)
Potassium: 4.8 mmol/L (ref 3.5–5.1)
Sodium: 145 mmol/L (ref 135–145)

## 2020-06-23 LAB — POCT I-STAT 7, (LYTES, BLD GAS, ICA,H+H)
Acid-Base Excess: 5 mmol/L — ABNORMAL HIGH (ref 0.0–2.0)
Acid-Base Excess: 4 mmol/L — ABNORMAL HIGH (ref 0.0–2.0)
Bicarbonate: 32.3 mmol/L — ABNORMAL HIGH (ref 20.0–28.0)
Bicarbonate: 32.3 mmol/L — ABNORMAL HIGH (ref 20.0–28.0)
Calcium, Ion: 1.19 mmol/L (ref 1.15–1.40)
Calcium, Ion: 1.15 mmol/L (ref 1.15–1.40)
HCT: 32 % — ABNORMAL LOW (ref 36.0–46.0)
HCT: 35 % — ABNORMAL LOW (ref 36.0–46.0)
Hemoglobin: 10.9 g/dL — ABNORMAL LOW (ref 12.0–15.0)
Hemoglobin: 11.9 g/dL — ABNORMAL LOW (ref 12.0–15.0)
O2 Saturation: 99 %
O2 Saturation: 96 %
Patient temperature: 37.1
Patient temperature: 99.1
Potassium: 4.8 mmol/L (ref 3.5–5.1)
Potassium: 4.9 mmol/L (ref 3.5–5.1)
Sodium: 142 mmol/L (ref 135–145)
Sodium: 143 mmol/L (ref 135–145)
TCO2: 34 mmol/L — ABNORMAL HIGH (ref 22–32)
TCO2: 34 mmol/L — ABNORMAL HIGH (ref 22–32)
pCO2 arterial: 64.5 mmHg — ABNORMAL HIGH (ref 32.0–48.0)
pCO2 arterial: 64.8 mmHg — ABNORMAL HIGH (ref 32.0–48.0)
pH, Arterial: 7.308 — ABNORMAL LOW (ref 7.350–7.450)
pH, Arterial: 7.307 — ABNORMAL LOW (ref 7.350–7.450)
pO2, Arterial: 137 mmHg — ABNORMAL HIGH (ref 83.0–108.0)
pO2, Arterial: 96 mmHg (ref 83.0–108.0)

## 2020-06-23 LAB — GLUCOSE, CAPILLARY
Glucose-Capillary: 319 mg/dL — ABNORMAL HIGH (ref 70–99)
Glucose-Capillary: 281 mg/dL — ABNORMAL HIGH (ref 70–99)
Glucose-Capillary: 230 mg/dL — ABNORMAL HIGH (ref 70–99)
Glucose-Capillary: 272 mg/dL — ABNORMAL HIGH (ref 70–99)
Glucose-Capillary: 329 mg/dL — ABNORMAL HIGH (ref 70–99)

## 2020-06-23 LAB — HEPARIN LEVEL (UNFRACTIONATED): Heparin Unfractionated: 0.54 IU/mL (ref 0.30–0.70)

## 2020-06-23 LAB — HEPARIN INDUCED PLATELET AB (HIT ANTIBODY): Heparin Induced Plt Ab: 0.111 OD (ref 0.000–0.400)

## 2020-06-23 LAB — C-REACTIVE PROTEIN: CRP: 0.8 mg/dL (ref <1.0)

## 2020-06-23 MED ORDER — FUROSEMIDE 10 MG/ML IJ SOLN
40.0000 mg | Freq: Four times a day (QID) | INTRAMUSCULAR | Status: AC
  Administered 2020-06-23 – 2020-06-24 (×3): 40 mg via INTRAVENOUS
  Filled 2020-06-23 (×3): qty 4

## 2020-06-23 MED ORDER — INSULIN DETEMIR 100 UNIT/ML ~~LOC~~ SOLN
25.0000 [IU] | Freq: Two times a day (BID) | SUBCUTANEOUS | Status: DC
  Administered 2020-06-23 – 2020-06-24 (×3): 25 [IU] via SUBCUTANEOUS
  Filled 2020-06-23 (×6): qty 0.25

## 2020-06-23 MED ORDER — INSULIN ASPART 100 UNIT/ML ~~LOC~~ SOLN
8.0000 [IU] | SUBCUTANEOUS | Status: DC
  Administered 2020-06-23 – 2020-06-28 (×26): 8 [IU] via SUBCUTANEOUS

## 2020-06-23 MED ORDER — FREE WATER
200.0000 mL | Status: DC
  Administered 2020-06-23 – 2020-06-28 (×32): 200 mL

## 2020-06-23 NOTE — Progress Notes (Signed)
Assisted family with tele-visit via elink 

## 2020-06-23 NOTE — Progress Notes (Signed)
ANTICOAGULATION CONSULT NOTE  Pharmacy Consult:  Heparin Indication:  DVT  No Known Allergies  Patient Measurements: Height: 5\' 7"  (170.2 cm) Weight: (!) 144.1 kg (317 lb 10.9 oz) IBW/kg (Calculated) : 61.6 Heparin Dosing Weight: 93 kg  Vital Signs: Temp: 99.1 F (37.3 C) (01/15 1240) Temp Source: Oral (01/15 1240) BP: 103/73 (01/15 1200) Pulse Rate: 81 (01/15 1200)  Labs: Recent Labs    06/21/20 0827 06/21/20 1559 06/21/20 1845 06/21/20 2224 06/22/20 0315 06/22/20 1236 06/23/20 0440 06/23/20 0630  HGB 10.4*   < >  --   --  10.9* 12.2 10.9* 11.3*  HCT 34.8*   < >  --   --  34.6* 36.0 32.0* 35.8*  PLT 148*  --   --   --  144*  --   --  162  HEPARINUNFRC  --   --  0.30  --  0.36  --   --  0.54  CREATININE  --   --   --  1.78* 1.66*  --   --  1.43*   < > = values in this interval not displayed.    Estimated Creatinine Clearance: 60.1 mL/min (A) (by C-G formula based on SCr of 1.43 mg/dL (H)).   Assessment: 56 YOF with Covid PNA and new DVT to continue on IV heparin.  Noted platelet fall from 300s on admit to 140s on 1/14, calculated 4T score is 5-6 indicating intermediate to high chance of HITT, with the possibility of new DVT. MD contacted and decision was made to order HIT antibody while continuing heparin.   Heparin level this morning remains therapeutic (HL 0.54 << 0.36, goal of 0.3-0.7). CBC stable, HIT Ab still ip today. Still with some dry blood in mouth noted per RN but stable.   Goal of Therapy:  Heparin level 0.3-0.7 units/ml Monitor platelets by anticoagulation protocol: Yes   Plan:  Continue heparin gtt at 1200 units/hr Daily heparin level and CBC Monitor closely for s/sx of bleeding Follow HIT antibody and need to transition to argatroban.   Thank you for allowing pharmacy to be a part of this patient's care.  2/14, PharmD, BCPS Clinical Pharmacist Clinical phone for 06/23/2020: 06/25/2020 06/23/2020 1:54 PM   **Pharmacist phone  directory can now be found on [email or web address] ([email or web address]).  Listed under [facility].

## 2020-06-23 NOTE — Progress Notes (Signed)
Pharmacy Antibiotic Note  Courtney Simmons is a 65 y.o. female admitted on 27-Jun-2020 with COVID and concern for superimposed PNA. Pharmacy consulted for Cefepime dosing.   SCr down 1.43, normalized CrCl~45 ml/min. Current dosing remains appropriate.  Plan: Continue Cefepime 2g IV every 12 hours Will continue to follow renal function, culture results, LOT, and antibiotic de-escalation plans   Height: 5\' 7"  (170.2 cm) Weight: (!) 144.1 kg (317 lb 10.9 oz) IBW/kg (Calculated) : 61.6  Temp (24hrs), Avg:98.2 F (36.8 C), Min:97.16 F (36.2 C), Max:99.14 F (37.3 C)  Recent Labs  Lab 06/20/20 0438 06/20/20 1316 06/20/20 1829 06/21/20 0439 06/21/20 0827 06/21/20 2224 06/22/20 0315 06/23/20 0630  WBC 30.3*  --   --  18.8* 20.3*  --  16.4* 17.7*  CREATININE 1.76*   < > 1.80* 1.74*  --  1.78* 1.66* 1.43*   < > = values in this interval not displayed.    Estimated Creatinine Clearance: 60.1 mL/min (A) (by C-G formula based on SCr of 1.43 mg/dL (H)).    No Known Allergies  Antimicrobials this admission: Vanc 1/12 x 1 Cefepime 1/12 >>  Remdesivir 1/8 >> (1/12)  Microbiology results: 1/8 BCx - negative 1/9 MRSA PCR - neg 1/12 TA - GPR / GNR / GPC / yeast on Gram stain >> normal oral flora 1/14 MRSA pcr neg  Thank you for allowing pharmacy to be a part of this patient's care.  2/14, PharmD, BCPS Clinical Pharmacist Clinical phone for 06/23/2020: [phone] 06/23/2020 2:06 PM   **Pharmacist phone directory can now be found on [email or web address] ([email or web address]).  Listed under [facility].

## 2020-06-23 NOTE — Progress Notes (Signed)
Patient placed back in supine position. RN assist x4. No complications.

## 2020-06-23 NOTE — Progress Notes (Signed)
NAME:  Courtney Simmons, MRN:  [id number], DOB:  Apr 06, 1956, LOS: 7 ADMISSION DATE:  06/20/2020, CONSULTATION DATE: 06/18/19 REFERRING MD:  Vanetta Mulders, CHIEF COMPLAINT:  Shortness of breath/respiratory failure.   Brief History:  65 yo woman with obesity hypothyroism, COVID PNA  Past Medical History:  ypothyroidism HLD Nephrolithiasis obesity Hx of back surgery, breast biopsy, ankle surgery Home meds: prednisone taper 9/21, vitamin D, zocor, levoxyl, prn advil and tylenol  Significant Hospital Events:  Intubated 1/9, Remdesivir and systemic steroids started. Also got Tocilizumab Proned 1/10, ddimer elevated. Got repeat Toci dose. LE [place] ordered. VT reduced to 6 cc/kg. + Acute RLE DVT involving femoral vein, popliteal, right posterior tib and gastrocnemius, fuill dose heparin started  1/11: getting NMB cont Prone positioning  1/13: Intermittent NMB, episode of hematochezia overnight per nurse report, remains on high vent settings 1/14: Proned overnight. Went into atrial fibrillation, rate controlled.   Consults:    Procedures:  Intubated 1/9 Central line R femoral 1/9  Significant Diagnostic Tests:  CXR 06/17/20 : Bilateral lung opacities with central air bronchograms 06/17/20: D-dimer 3.59, CRP 24, ferritin 938 06/21/20: CRP 3.3, D-dimer 11.9.  Chest x-ray showed stable bilateral opacities  Micro Data:  06/25/2020: Covid + Blood culture 06/15/2020 >> no growth to date x2 days Respiratory culture 1/12>>> moderate WBC, pending gram-positive rods, few gram-negative rods, few gram-negative cocci, culture reincubated  Antimicrobials:  Tocilizumab on 1/9 and 1/10 Remdesivir 1/9>>> 1/14 Vancomycin 1/12>> Cefepime 1/12>>   Interim History / Subjective:   Sedated. Re-proned. sats stable. Remains critically ill on vent   Objective   Blood pressure 111/71, pulse 89, temperature 98.6 F (37 C), temperature source Oral, resp. rate (!) 36, height 5\' 7"  (1.702 m), weight (!) 144.1 kg,  SpO2 96 %.    Vent Mode: PRVC FiO2 (%):  [50 %-60 %] 50 % Set Rate:  [35 bmp] 35 bmp Vt Set:  [370 mL] 370 mL PEEP:  [16 cmH20] 16 cmH20 Plateau Pressure:  [22 cmH20-31 cmH20] 22 cmH20   Intake/Output Summary (Last 24 hours) at 06/23/2020 1033 Last data filed at 06/23/2020 0900 Gross per 24 hour  Intake 4475.02 ml  Output 2510 ml  Net 1965.02 ml   Filed Weights   06/21/20 0434 06/22/20 0500 06/23/20 0400  Weight: 132 kg 132.7 kg (!) 144.1 kg    Examination:  General: Proned, obese FM, on mechanical life support  HEENT: ETT in place, facial edema  Cardiac: RRR, sinus on tele  Pulmonary: BL vented breaths  Abdomen: no examined as she was proned Extremity: BL trace LE edema  Neuro: sedated unresponsive on vent   Resolved Hospital Problem list     Assessment & Plan:   Acute hypoxic and hypercarbic respiratory failure secondary to Covid pneumonia: Status post Tocilizumab, currently on remdesivir and steroids. Plan:  Continue mechanical ventilation per ARDS protocol Target TVol 6-8cc/kgIBW Target Plateau Pressure < 30cm H20 Target driving pressure less than 15 cm of water Target PaO2 55-65: titrate PEEP/FiO2 per protocol As long as PaO2 to FiO2 ratio is less than 1:150 position in prone position for 16 hours a day Ventilator associated pneumonia prevention protocol Completed course of remdesivir  - continue steroids and start titrating down IV lasix today    Acute DVT (RLE) Plan Continue IV heparin   New onset atrial fibrillation: Related to critical illness  Already on heparin  Rate controlled and appears only intermittently   Hematochezia: - no additional bleeding  - continue to observe  Marked leukocytosis WBC improving, has remained afebrile.  Respiratory culture on 1/12 pending, shows moderate white blood cell, few, negative rods, few gram-positive cocci.  Procalcitonin and WBC improving. Has remained afebrile.  Plan: vanco stopped  Complete 5 days  with cefepime and observe   AKI: Plan:  Follow IOs  BMP Diuresis again today  Appears to be improving   Fluid and electrolyte imbalance: Hyperkalemia, hyperchloremia, NAG metabolic acidosis  - observe   Hyperglycemia: CBGs  SSI and levemir  Increased today   Hx hypothyroidism:  - home levothyroxine   HLD: -statin   Best practice (evaluated daily)  Diet: tube feeds  Pain/Anxiety/Delirium protocol (if indicated): fentanyl, versed PRN NMB  VAP protocol (if indicated): yes DVT prophylaxis: heparin gtt started 1/10 GI prophylaxis: protonix Glucose control: SSI and levemir  Mobility: BR, currently proned Disposition:ICU  Goals of Care:  Last date of multidisciplinary goals of care discussion: Family and staff present: I spoke with patients sister-inlaw tracy via phone 06/23/2020 Summary of discussion:  Follow up goals of care discussion due:  Code Status: Full Code   This patient is critically ill with multiple organ system failure; which, requires frequent high complexity decision making, assessment, support, evaluation, and titration of therapies. This was completed through the application of advanced monitoring technologies and extensive interpretation of multiple databases. During this encounter critical care time was devoted to patient care services described in this note for 33 minutes.  Josephine Igo, DO [facility] 06/23/2020 10:33 AM

## 2020-06-23 NOTE — Progress Notes (Signed)
Assisted tele visit to patient with family member.  Sincerity Cedar R, RN  

## 2020-06-24 DIAGNOSIS — U071 COVID-19: Secondary | ICD-10-CM | POA: Diagnosis not present

## 2020-06-24 DIAGNOSIS — J1282 Pneumonia due to coronavirus disease 2019: Secondary | ICD-10-CM | POA: Diagnosis not present

## 2020-06-24 DIAGNOSIS — J80 Acute respiratory distress syndrome: Secondary | ICD-10-CM | POA: Diagnosis not present

## 2020-06-24 LAB — GLUCOSE, CAPILLARY
Glucose-Capillary: 168 mg/dL — ABNORMAL HIGH (ref 70–99)
Glucose-Capillary: 197 mg/dL — ABNORMAL HIGH (ref 70–99)
Glucose-Capillary: 225 mg/dL — ABNORMAL HIGH (ref 70–99)
Glucose-Capillary: 235 mg/dL — ABNORMAL HIGH (ref 70–99)
Glucose-Capillary: 253 mg/dL — ABNORMAL HIGH (ref 70–99)
Glucose-Capillary: 296 mg/dL — ABNORMAL HIGH (ref 70–99)
Glucose-Capillary: 324 mg/dL — ABNORMAL HIGH (ref 70–99)

## 2020-06-24 LAB — POCT I-STAT 7, (LYTES, BLD GAS, ICA,H+H)
Acid-Base Excess: 8 mmol/L — ABNORMAL HIGH (ref 0.0–2.0)
Bicarbonate: 36.2 mmol/L — ABNORMAL HIGH (ref 20.0–28.0)
Calcium, Ion: 1.19 mmol/L (ref 1.15–1.40)
HCT: 33 % — ABNORMAL LOW (ref 36.0–46.0)
Hemoglobin: 11.2 g/dL — ABNORMAL LOW (ref 12.0–15.0)
O2 Saturation: 100 %
Patient temperature: 97.9
Potassium: 4.9 mmol/L (ref 3.5–5.1)
Sodium: 144 mmol/L (ref 135–145)
TCO2: 38 mmol/L — ABNORMAL HIGH (ref 22–32)
pCO2 arterial: 69.4 mmHg (ref 32.0–48.0)
pH, Arterial: 7.323 — ABNORMAL LOW (ref 7.350–7.450)
pO2, Arterial: 189 mmHg — ABNORMAL HIGH (ref 83.0–108.0)

## 2020-06-24 LAB — CBC
HCT: 36.2 % (ref 36.0–46.0)
Hemoglobin: 11.4 g/dL — ABNORMAL LOW (ref 12.0–15.0)
MCH: 30 pg (ref 26.0–34.0)
MCHC: 31.5 g/dL (ref 30.0–36.0)
MCV: 95.3 fL (ref 80.0–100.0)
Platelets: 191 10*3/uL (ref 150–400)
RBC: 3.8 MIL/uL — ABNORMAL LOW (ref 3.87–5.11)
RDW: 13.8 % (ref 11.5–15.5)
WBC: 16.3 10*3/uL — ABNORMAL HIGH (ref 4.0–10.5)
nRBC: 0.3 % — ABNORMAL HIGH (ref 0.0–0.2)

## 2020-06-24 LAB — HEPARIN LEVEL (UNFRACTIONATED)
Heparin Unfractionated: 0.6 IU/mL (ref 0.30–0.70)
Heparin Unfractionated: 0.39 IU/mL (ref 0.30–0.70)

## 2020-06-24 LAB — BASIC METABOLIC PANEL
Anion gap: 9 (ref 5–15)
BUN: 109 mg/dL — ABNORMAL HIGH (ref 8–23)
CO2: 31 mmol/L (ref 22–32)
Calcium: 8 mg/dL — ABNORMAL LOW (ref 8.9–10.3)
Chloride: 104 mmol/L (ref 98–111)
Creatinine, Ser: 1.37 mg/dL — ABNORMAL HIGH (ref 0.44–1.00)
GFR, Estimated: 43 mL/min — ABNORMAL LOW (ref >60)
Glucose, Bld: 263 mg/dL — ABNORMAL HIGH (ref 70–99)
Potassium: 5.2 mmol/L — ABNORMAL HIGH (ref 3.5–5.1)
Sodium: 144 mmol/L (ref 135–145)

## 2020-06-24 LAB — C-REACTIVE PROTEIN: CRP: 0.6 mg/dL (ref <1.0)

## 2020-06-24 MED ORDER — METHYLPREDNISOLONE SODIUM SUCC 40 MG IJ SOLR
40.0000 mg | INTRAMUSCULAR | Status: DC
  Administered 2020-06-24 – 2020-06-26 (×3): 40 mg via INTRAVENOUS
  Filled 2020-06-24 (×3): qty 1

## 2020-06-24 MED ORDER — METOLAZONE 5 MG PO TABS
5.0000 mg | ORAL_TABLET | Freq: Once | ORAL | Status: AC
  Administered 2020-06-24: 5 mg via ORAL
  Filled 2020-06-24: qty 1

## 2020-06-24 MED ORDER — FUROSEMIDE 10 MG/ML IJ SOLN
40.0000 mg | Freq: Four times a day (QID) | INTRAMUSCULAR | Status: AC
  Administered 2020-06-24 (×3): 40 mg via INTRAVENOUS
  Filled 2020-06-24 (×3): qty 4

## 2020-06-24 MED ORDER — SODIUM CHLORIDE 0.9 % IV SOLN
2.0000 g | Freq: Three times a day (TID) | INTRAVENOUS | Status: DC
  Administered 2020-06-24 – 2020-06-25 (×3): 2 g via INTRAVENOUS
  Filled 2020-06-24 (×3): qty 2

## 2020-06-24 NOTE — Progress Notes (Signed)
PHARMACY NOTE:  ANTIMICROBIAL RENAL DOSAGE ADJUSTMENT  Current antimicrobial regimen includes a mismatch between antimicrobial dosage and estimated renal function.  As per policy approved by the Pharmacy & Therapeutics and Medical Executive Committees, the antimicrobial dosage will be adjusted accordingly.  Current antimicrobial dosage:  Cefepime 2g q12h   Indication: Leukocytosis, ONA  Renal Function:  Estimated Creatinine Clearance: 62.8 mL/min (A) (by C-G formula based on SCr of 1.37 mg/dL (H)). []      On intermittent HD, scheduled: []      On CRRT    Antimicrobial dosage has been changed to:  Cefepime 2g q8h   Additional comments:   Thank you for allowing pharmacy to be a part of this patient's care.  , PharmD Clinical Pharmacist  06/24/2020 12:29 PM

## 2020-06-24 NOTE — Plan of Care (Signed)
PCCM Goals of Care Discussion and Advanced Care Planning:   Date: 06/24/2020   Present Parties: patients sister-in-law via phone which is listed as her primary contact.   What was discussed: current medical condition and treatments for covid-19 ARDS. I spoke with Courtney Simmons via phone yesterday and today.    Outcome: Remains full code and hopeful for recovery   16 mins of time was spent discussing the goals of care, advanced care planning options such as code status as well as do not resuscitate forms.

## 2020-06-24 NOTE — Progress Notes (Signed)
ANTICOAGULATION CONSULT NOTE  Pharmacy Consult:  Heparin Indication:  DVT  No Known Allergies  Patient Measurements: Height: 5\' 7"  (170.2 cm) Weight: (!) 144.1 kg (317 lb 10.9 oz) IBW/kg (Calculated) : 61.6 Heparin Dosing Weight: 93 kg  Vital Signs: Temp: 98.9 F (37.2 C) (01/16 1200) Temp Source: Oral (01/16 1200) BP: 114/64 (01/16 1700) Pulse Rate: 83 (01/16 1700)  Labs: Recent Labs    06/22/20 0315 06/22/20 1236 06/23/20 0630 06/23/20 1441 06/24/20 0430 06/24/20 1111 06/24/20 1755  HGB 10.9*   < > 11.3* 11.9* 11.4* 11.2*  --   HCT 34.6*   < > 35.8* 35.0* 36.2 33.0*  --   PLT 144*  --  162  --  191  --   --   HEPARINUNFRC 0.36  --  0.54  --  0.60  --  0.39  CREATININE 1.66*  --  1.43*  --  1.37*  --   --    < > = values in this interval not displayed.    Estimated Creatinine Clearance: 62.8 mL/min (A) (by C-G formula based on SCr of 1.37 mg/dL (H)).   Assessment: 40 YOF with Covid PNA and new DVT to continue on IV heparin.  Noted platelet fall from 300s on admit to 140s on 1/14, calculated 4T score is 5-6 indicating intermediate to high chance of HITT, with the possibility of new DVT. MD contacted and decision was made to order HIT antibody while continuing heparin. HIT antibody now resulted negative.   Heparin level 0.39 this evening  on heparin 1150 units/hr is therapeutic. Hgb 11.4. Plt 191 - increasing. Per RN, night nurse noted some slight  blood tinged secretions in ET tube but no other signs of bleeding.   Goal of Therapy:  Heparin level 0.3-0.7 units/ml Monitor platelets by anticoagulation protocol: Yes   Plan:  Continue heparin gtt at 1150 units/hr Daily heparin level and CBC Monitor closely for s/sx of bleeding    2/14 Pharm.D. CPP, BCPS Clinical Pharmacist [phone] 06/24/2020 6:54 PM

## 2020-06-24 NOTE — Progress Notes (Addendum)
ANTICOAGULATION CONSULT NOTE  Pharmacy Consult:  Heparin Indication:  DVT  No Known Allergies  Patient Measurements: Height: 5\' 7"  (170.2 cm) Weight: (!) 144.1 kg (317 lb 10.9 oz) IBW/kg (Calculated) : 61.6 Heparin Dosing Weight: 93 kg  Vital Signs: Temp: 98.4 F (36.9 C) (01/15 1928) Temp Source: Oral (01/15 1928) BP: 127/68 (01/16 0600) Pulse Rate: 98 (01/16 0600)  Labs: Recent Labs    06/22/20 0315 06/22/20 1236 06/23/20 0630 06/23/20 1441 06/24/20 0430  HGB 10.9*   < > 11.3* 11.9* 11.4*  HCT 34.6*   < > 35.8* 35.0* 36.2  PLT 144*  --  162  --  191  HEPARINUNFRC 0.36  --  0.54  --  0.60  CREATININE 1.66*  --  1.43*  --  1.37*   < > = values in this interval not displayed.    Estimated Creatinine Clearance: 62.8 mL/min (A) (by C-G formula based on SCr of 1.37 mg/dL (H)).   Assessment: 53 YOF with Covid PNA and new DVT to continue on IV heparin.  Noted platelet fall from 300s on admit to 140s on 1/14, calculated 4T score is 5-6 indicating intermediate to high chance of HITT, with the possibility of new DVT. MD contacted and decision was made to order HIT antibody while continuing heparin. HIT antibody now resulted negative.   Heparin level 0.60 on heparin 1200 units/hr is therapeutic. Hgb 11.4. Plt 191 - increasing. Per RN, night nurse noted some slight  blood tinged secretions in ET tube but no other signs of bleeding.   Goal of Therapy:  Heparin level 0.3-0.7 units/ml Monitor platelets by anticoagulation protocol: Yes   Plan:  Continue heparin gtt at 1200 units/hr Daily heparin level and CBC Monitor closely for s/sx of bleeding   Thank you for allowing pharmacy to be a part of this patient's care.  2/14, PharmD Clinical Pharmacist  06/24/2020 6:41 AM    Addendum: RN reported noticing bleeding coming from patient's mouth.  Plan: - Decrease heparin to 1150 units/hr  - Check heparin level at 1800 - Will inform Dr. 06/26/2020 of bleeding

## 2020-06-24 NOTE — Progress Notes (Signed)
NAME:  Courtney Simmons, MRN:  [id number], DOB:  [birth year], LOS: 8 ADMISSION DATE:  06/21/2020, CONSULTATION DATE: 06/18/19 REFERRING MD:  Vanetta Mulders, CHIEF COMPLAINT:  Shortness of breath/respiratory failure.   Brief History:  65 yo woman with obesity hypothyroism, COVID PNA  Past Medical History:  ypothyroidism HLD Nephrolithiasis obesity Hx of back surgery, breast biopsy, ankle surgery Home meds: prednisone taper 9/21, vitamin D, zocor, levoxyl, prn advil and tylenol  Significant Hospital Events:  Intubated 1/9, Remdesivir and systemic steroids started. Also got Tocilizumab Proned 1/10, ddimer elevated. Got repeat Toci dose. LE [place] ordered. VT reduced to 6 cc/kg. + Acute RLE DVT involving femoral vein, popliteal, right posterior tib and gastrocnemius, fuill dose heparin started  1/11: getting NMB cont Prone positioning  1/13: Intermittent NMB, episode of hematochezia overnight per nurse report, remains on high vent settings 1/14: Proned overnight. Went into atrial fibrillation, rate controlled.   Consults:    Procedures:  Intubated 1/9 Central line R femoral 1/9  Significant Diagnostic Tests:  CXR 06/17/20 : Bilateral lung opacities with central air bronchograms 06/17/20: D-dimer 3.59, CRP 24, ferritin 938 06/21/20: CRP 3.3, D-dimer 11.9.  Chest x-ray showed stable bilateral opacities  Micro Data:  06/27/2020: Covid + Blood culture 07/08/2020 >> no growth to date x2 days Respiratory culture 1/12>>> moderate WBC, pending gram-positive rods, few gram-negative rods, few gram-negative cocci, culture reincubated  Antimicrobials:  Tocilizumab on 1/9 and 1/10 Remdesivir 1/9>>> 1/14 Vancomycin 1/12>> Cefepime 1/12>>   Interim History / Subjective:   Patient remains critically ill on mechanical life support.  Objective   Blood pressure 116/61, pulse 85, temperature 98.4 F (36.9 C), temperature source Oral, resp. rate (!) 24, height 5\' 7"  (1.702 m), weight (!) 144.1 kg, SpO2  91 %.    Vent Mode: PRVC FiO2 (%):  [40 %-60 %] 50 % Set Rate:  [5 bmp-35 bmp] 5 bmp Vt Set:  [370 mL] 370 mL PEEP:  [16 cmH20] 16 cmH20 Plateau Pressure:  [21 cmH20-29 cmH20] 29 cmH20   Intake/Output Summary (Last 24 hours) at 06/24/2020 1100 Last data filed at 06/24/2020 0700 Gross per 24 hour  Intake 2694.18 ml  Output 3700 ml  Net -1005.82 ml   Filed Weights   06/21/20 0434 06/22/20 0500 06/23/20 0400  Weight: 132 kg 132.7 kg (!) 144.1 kg    Examination:  General: Female, intubated on mechanical life support HEENT: Endotracheal tube in place, facial edema Cardiac: Regular rhythm, sinus on telemetry S1-S2 Pulmonary: Lateral mechanically ventilated breath sounds Abdomen: Pannus soft nontender Extremity: Bilateral lower extremity edema dependent edema Neuro: Sedated on mechanical support  Resolved Hospital Problem list     Assessment & Plan:   Acute hypoxic and hypercarbic respiratory failure secondary to Covid pneumonia: Status post Tocilizumab, currently on remdesivir and steroids. Plan:  Continue mechanical ventilation per ARDS protocol Target TVol 6-8cc/kgIBW Target Plateau Pressure < 30cm H20 Target driving pressure less than 15 cm of water Target PaO2 55-65: titrate PEEP/FiO2 per protocol As long as PaO2 to FiO2 ratio is less than 1:150 position in prone position for 16 hours a day Check CVP daily if CVL in place Ventilator associated pneumonia prevention protocol Repeat blood gas before planning to read prone. Will make decision then. Continue diuresis today for positive cumulative fluid balance.  Acute DVT (RLE) Plan Continue on IV heparin  New onset atrial fibrillation: -Remains on heparin, rate controlled  Hematochezia: -No additional bleeding, continue to observe  Marked leukocytosis WBC improving, has remained afebrile.  Respiratory culture on 1/12 pending, shows moderate white blood cell, few, negative rods, few gram-positive cocci.   Procalcitonin and WBC improving. Has remained afebrile.  Plan: Vancomycin stopped Complete course of cefepime.  AKI: Plan:  Follow I/O's Follow BMP Diuresis again today Lasix 40 mg every 6 hours, metolazone 5 mg x 1.   Fluid and electrolyte imbalance: Hyperkalemia, hyperchloremia, NAG metabolic acidosis  - observe   Hyperglycemia: CBGS + SSI + Levemir   Hx hypothyroidism:  - home levothyroxine   HLD: -statin   Best practice (evaluated daily)  Diet: tube feeds  Pain/Anxiety/Delirium protocol (if indicated): fentanyl, versed PRN NMB  VAP protocol (if indicated): yes DVT prophylaxis: heparin gtt started 1/10 GI prophylaxis: protonix Glucose control: SSI and levemir  Mobility: BR, currently proned Disposition:ICU  Goals of Care:  Last date of multidisciplinary goals of care discussion: Family and staff present: I spoke with patients sister-inlaw tracy via phone 06/24/2020 Summary of discussion:  Follow up goals of care discussion due:  Code Status: Full Code   This patient is critically ill with multiple organ system failure; which, requires frequent high complexity decision making, assessment, support, evaluation, and titration of therapies. This was completed through the application of advanced monitoring technologies and extensive interpretation of multiple databases. During this encounter critical care time was devoted to patient care services described in this note for 32 minutes.   Josephine Igo, DO [facility] 06/24/2020 11:00 AM

## 2020-06-25 DIAGNOSIS — N179 Acute kidney failure, unspecified: Secondary | ICD-10-CM | POA: Diagnosis not present

## 2020-06-25 DIAGNOSIS — U071 COVID-19: Secondary | ICD-10-CM | POA: Diagnosis not present

## 2020-06-25 DIAGNOSIS — J9601 Acute respiratory failure with hypoxia: Secondary | ICD-10-CM | POA: Diagnosis not present

## 2020-06-25 LAB — CBC
HCT: 38.9 % (ref 36.0–46.0)
Hemoglobin: 12.2 g/dL (ref 12.0–15.0)
MCH: 29.9 pg (ref 26.0–34.0)
MCHC: 31.4 g/dL (ref 30.0–36.0)
MCV: 95.3 fL (ref 80.0–100.0)
Platelets: 201 10*3/uL (ref 150–400)
RBC: 4.08 MIL/uL (ref 3.87–5.11)
RDW: 13.9 % (ref 11.5–15.5)
WBC: 17.2 10*3/uL — ABNORMAL HIGH (ref 4.0–10.5)
nRBC: 0.2 % (ref 0.0–0.2)

## 2020-06-25 LAB — BASIC METABOLIC PANEL
Anion gap: 11 (ref 5–15)
BUN: 108 mg/dL — ABNORMAL HIGH (ref 8–23)
CO2: 31 mmol/L (ref 22–32)
Calcium: 8.3 mg/dL — ABNORMAL LOW (ref 8.9–10.3)
Chloride: 102 mmol/L (ref 98–111)
Creatinine, Ser: 1.3 mg/dL — ABNORMAL HIGH (ref 0.44–1.00)
GFR, Estimated: 46 mL/min — ABNORMAL LOW (ref >60)
Glucose, Bld: 214 mg/dL — ABNORMAL HIGH (ref 70–99)
Potassium: 5.4 mmol/L — ABNORMAL HIGH (ref 3.5–5.1)
Sodium: 144 mmol/L (ref 135–145)

## 2020-06-25 LAB — C-REACTIVE PROTEIN: CRP: <0.5 mg/dL (ref <1.0)

## 2020-06-25 LAB — GLUCOSE, CAPILLARY
Glucose-Capillary: 144 mg/dL — ABNORMAL HIGH (ref 70–99)
Glucose-Capillary: 174 mg/dL — ABNORMAL HIGH (ref 70–99)
Glucose-Capillary: 182 mg/dL — ABNORMAL HIGH (ref 70–99)
Glucose-Capillary: 194 mg/dL — ABNORMAL HIGH (ref 70–99)
Glucose-Capillary: 202 mg/dL — ABNORMAL HIGH (ref 70–99)
Glucose-Capillary: 234 mg/dL — ABNORMAL HIGH (ref 70–99)

## 2020-06-25 LAB — HEPARIN LEVEL (UNFRACTIONATED): Heparin Unfractionated: 0.68 IU/mL (ref 0.30–0.70)

## 2020-06-25 MED ORDER — INSULIN DETEMIR 100 UNIT/ML ~~LOC~~ SOLN
30.0000 [IU] | Freq: Two times a day (BID) | SUBCUTANEOUS | Status: DC
  Administered 2020-06-25 – 2020-06-27 (×5): 30 [IU] via SUBCUTANEOUS
  Filled 2020-06-25 (×9): qty 0.3

## 2020-06-25 MED ORDER — SODIUM ZIRCONIUM CYCLOSILICATE 10 G PO PACK
10.0000 g | PACK | Freq: Once | ORAL | Status: AC
  Administered 2020-06-25: 10 g
  Filled 2020-06-25: qty 1

## 2020-06-25 MED ORDER — VITAL 1.5 CAL PO LIQD
1000.0000 mL | ORAL | Status: DC
  Administered 2020-06-26 – 2020-06-28 (×3): 1000 mL
  Filled 2020-06-25 (×3): qty 1000

## 2020-06-25 MED ORDER — FUROSEMIDE 10 MG/ML IJ SOLN
40.0000 mg | Freq: Four times a day (QID) | INTRAMUSCULAR | Status: AC
Start: 2020-06-25 — End: 2020-06-25
  Administered 2020-06-25 (×3): 40 mg via INTRAVENOUS
  Filled 2020-06-25 (×3): qty 4

## 2020-06-25 MED ORDER — METOCLOPRAMIDE HCL 5 MG/ML IJ SOLN
10.0000 mg | Freq: Once | INTRAMUSCULAR | Status: AC
  Administered 2020-06-25: 10 mg via INTRAVENOUS
  Filled 2020-06-25: qty 2

## 2020-06-25 NOTE — Progress Notes (Signed)
Nutrition Follow-up  DOCUMENTATION CODES:   Morbid obesity  INTERVENTION:   Tube Feeding via Cortrak:  Increase Vital 1.5 to 55 ml/hr Pro-Source 45 mL QID Provides 2140 kcals, 133 g of protein and 1003 mL of free water   NUTRITION DIAGNOSIS:   Inadequate oral intake related to acute illness as evidenced by NPO status. Being addressed via TF   GOAL:   Patient will meet greater than or equal to 90% of their needs  Progressing  MONITOR:   Vent status,Weight trends,TF tolerance,Labs,Skin  REASON FOR ASSESSMENT:   Consult,Ventilator Enteral/tube feeding initiation and management  ASSESSMENT:   65 yo unvaccinated female admitted with acute respiratory failure with COVID-19 pneumonia requiring inutbation. Pt with N/V/D since 12/31.  PMH includes hypothyroidism, HLD  1/09 Intubated 1/12 Cortrak placed  Pt remains sedated on vent support, continuing prone positioning as appropriate  Vital 1.5 at 50 ml/hr, Pro-Source TF 45 mL QID  Current wt 140.7 kg; admit wt 127 kg; lowest wt 124 kg  Labs: BUN 108, Creatinine 1.30, potassium 5.4 Meds: lasix, ss novolog, novolog q 4 hours, levemir, solumedrol, lokelma  Diet Order:   Diet Order            Diet NPO time specified  Diet effective now                 EDUCATION NEEDS:   Not appropriate for education at this time  Skin:  Skin Assessment: Skin Integrity Issues: Skin Integrity Issues:: Other (Comment) Other: skin tear left hip  Last BM:  1/15  Height:   Ht Readings from Last 1 Encounters:  06/20/20 5\' 7"  (1.702 m)    Weight:   Wt Readings from Last 1 Encounters:  06/25/20 (!) 140.7 kg    BMI:  Body mass index is 48.58 kg/m.  Estimated Nutritional Needs:   Kcal:  1900-2100 kcals  Protein:  125-145 g  Fluid:  >/= 2 L   06/27/20 MS, RDN, LDN, CNSC Registered Dietitian III Clinical Nutrition RD Pager and On-Call Pager Number Located in [place]

## 2020-06-25 NOTE — Progress Notes (Signed)
NAME:  Courtney Simmons, MRN:  [id number], DOB:  07/20/1955, LOS: 9 ADMISSION DATE:  07-06-20, CONSULTATION DATE: 06/18/19 REFERRING MD:  Vanetta Mulders, CHIEF COMPLAINT:  Shortness of breath/respiratory failure.   Brief History:  65 yo woman with obesity hypothyroism, COVID PNA, intubated  Past Medical History:  Hypothyroidism HLD Nephrolithiasis obesity Hx of back surgery, breast biopsy, ankle surgery Home meds: prednisone taper 9/21, vitamin D, zocor, levoxyl, prn advil and tylenol  Significant Hospital Events:  Intubated 1/9, Remdesivir and systemic steroids started. Also got Tocilizumab Proned 1/10, ddimer elevated. Got repeat Toci dose. LE [place] ordered. VT reduced to 6 cc/kg. + Acute RLE DVT involving femoral vein, popliteal, right posterior tib and gastrocnemius, fuill dose heparin started  1/11: getting NMB cont Prone positioning  1/13: Intermittent NMB, episode of hematochezia overnight per nurse report, remains on high vent settings 1/14: Proned overnight. Went into atrial fibrillation, rate controlled.   Consults:    Procedures:  Intubated 1/9 Central line R femoral 1/9  Significant Diagnostic Tests:  CXR 06/17/20 : Bilateral lung opacities with central air bronchograms 06/17/20: D-dimer 3.59, CRP 24, ferritin 938 06/21/20: CRP 3.3, D-dimer 11.9.  Chest x-ray showed stable bilateral opacities  Micro Data:  2020-07-06: Covid + Blood culture 2020/07/06 >> no growth to date x2 days Respiratory culture 1/12>>> moderate WBC, pending gram-positive rods, few gram-negative rods, few gram-negative cocci, culture reincubated  Antimicrobials:  Tocilizumab on 1/9 and 1/10 Remdesivir 1/9>>> 1/14 Vancomycin 1/12 Cefepime 1/12>> 1/17  Interim History / Subjective:   Patient remains critically ill on mechanical life support.  Serum creatinine is improving but developed hyperkalemia  Objective   Blood pressure 112/66, pulse 85, temperature 99.4 F (37.4 C), temperature source  Oral, resp. rate 17, height 5\' 7"  (1.702 m), weight (!) 140.7 kg, SpO2 92 %.    Vent Mode: PRVC FiO2 (%):  [60 %-70 %] 65 % Set Rate:  [35 bmp] 35 bmp Vt Set:  [370 mL] 370 mL PEEP:  [16 cmH20] 16 cmH20 Plateau Pressure:  [29 cmH20-35 cmH20] 29 cmH20   Intake/Output Summary (Last 24 hours) at 06/25/2020 1026 Last data filed at 06/25/2020 0700 Gross per 24 hour  Intake 1909 ml  Output 4970 ml  Net -3061 ml   Filed Weights   06/22/20 0500 06/23/20 0400 06/25/20 0342  Weight: 132.7 kg (!) 144.1 kg (!) 140.7 kg    Examination:  General: Middle-aged morbidly obese female, orally intubated on mechanical life support HEENT: Atraumatic, normocephalic endotracheal tube in place, facial edema Cardiac: Irregularly irregular, no murmur Pulmonary: Lateral mechanically ventilated breath sounds, no wheezes or rhonchi Abdomen: Pannus soft nontender, bowel sounds present Extremity: Bilateral lower extremity edema dependent edema Neuro: Sedated, eyes remain closed, not following commands  Resolved Hospital Problem list   Hematochezia  Assessment & Plan:   Acute hypoxic and hypercapnic respiratory failure due to ARDS from Covid pneumonia: S/p Tocilizumab and remdesivir Continue IV steroids. Continue mechanical ventilation per ARDS protocol Target TVol 6-8cc/kgIBW Target Plateau Pressure < 30cm H20, driving pressure less than 15 cm of water Target PaO2 55-65: titrate PEEP/FiO2 per protocol Ventilator associated pneumonia prevention protocol Patient's P/F ratio is over 150, no need to prone Continue aggressive diuresis Procalcitonin has trended down, patient completed 5 days therapy with cefepime, discontinue antibiotics  Acute DVT (RLE) Continue on IV heparin  New onset paroxysmal atrial fibrillation: Heart rate remains well controlled Continue heparin infusion for stroke prophylaxis  AKI: Hyperkalemia Serum creatinine is improving Follow I/O's Follow BMP 1  dose of Lokelma  today, monitor serum potassium  Hyperglycemia: CBGS + SSI + Levemir   Hypothyroidism:  Continue levothyroxine   Best practice (evaluated daily)  Diet: tube feeds  Pain/Anxiety/Delirium protocol (if indicated): fentanyl, versed PRN NMB  VAP protocol (if indicated): yes DVT prophylaxis: heparin gtt started 1/10 GI prophylaxis: protonix Glucose control: SSI and levemir  Mobility: Bedrest for now Disposition:ICU  Goals of Care:  Last date of multidisciplinary goals of care discussion: 1/16 Family and staff present:With patients sister-inlaw tracy via phone Summary of discussion: Continue full aggressive care Follow up goals of care discussion due: 1/23 Code Status: Full Code    Total critical care time: 47 minutes  Performed by: Cheri Fowler   Critical care time was exclusive of separately billable procedures and treating other patients.   Critical care was necessary to treat or prevent imminent or life-threatening deterioration.   Critical care was time spent personally by me on the following activities: development of treatment plan with patient and/or surrogate as well as nursing, discussions with consultants, evaluation of patient's response to treatment, examination of patient, obtaining history from patient or surrogate, ordering and performing treatments and interventions, ordering and review of laboratory studies, ordering and review of radiographic studies, pulse oximetry and re-evaluation of patient's condition.   Cheri Fowler MD [facility] Pager: [phone] Mobile: [phone]

## 2020-06-25 NOTE — Progress Notes (Signed)
Assisted tele visit to patient with family member.  Nat Lowenthal D Skilynn Durney, RN   

## 2020-06-25 NOTE — Progress Notes (Signed)
ANTICOAGULATION CONSULT NOTE  Pharmacy Consult:  Heparin Indication:  DVT  No Known Allergies  Patient Measurements: Height: 5\' 7"  (170.2 cm) Weight: (!) 140.7 kg (310 lb 3 oz) IBW/kg (Calculated) : 61.6 Heparin Dosing Weight: 93 kg  Vital Signs: Temp: 99.4 F (37.4 C) (01/17 0000) Temp Source: Oral (01/17 0000) BP: 110/69 (01/17 0700) Pulse Rate: 93 (01/17 0700)  Labs: Recent Labs    06/23/20 0630 06/23/20 1441 06/24/20 0430 06/24/20 1111 06/24/20 1755 06/25/20 0243  HGB 11.3*   < > 11.4* 11.2*  --  12.2  HCT 35.8*   < > 36.2 33.0*  --  38.9  PLT 162  --  191  --   --  201  HEPARINUNFRC 0.54  --  0.60  --  0.39 0.68  CREATININE 1.43*  --  1.37*  --   --  1.30*   < > = values in this interval not displayed.    Estimated Creatinine Clearance: 65.2 mL/min (A) (by C-G formula based on SCr of 1.3 mg/dL (H)).   Assessment: 52 YOF with Covid PNA and new DVT to continue on IV heparin.  Noted platelet fall from 300s on admit to 140s on 1/14, calculated 4T score is 5-6 indicating intermediate to high chance of HITT, with the possibility of new DVT. MD contacted and decision was made to order HIT antibody while continuing heparin. HIT antibody now resulted negative.   Heparin level within goal  Minimal bleeding per RN  Goal of Therapy:  Heparin level 0.3-0.7 units/ml Monitor platelets by anticoagulation protocol: Yes   Plan:  Continue heparin gtt at 1150 units/hr Daily heparin level and CBC Monitor closely for s/sx of bleeding  2/14, PharmD, BCPS, BCCCP Clinical Pharmacist [phone]  Please check AMION for all [facility] Pharmacy numbers  06/25/2020 8:20 AM

## 2020-06-26 LAB — GLUCOSE, CAPILLARY
Glucose-Capillary: 100 mg/dL — ABNORMAL HIGH (ref 70–99)
Glucose-Capillary: 83 mg/dL (ref 70–99)
Glucose-Capillary: 88 mg/dL (ref 70–99)
Glucose-Capillary: 163 mg/dL — ABNORMAL HIGH (ref 70–99)
Glucose-Capillary: 200 mg/dL — ABNORMAL HIGH (ref 70–99)
Glucose-Capillary: 215 mg/dL — ABNORMAL HIGH (ref 70–99)

## 2020-06-26 LAB — CBC
HCT: 35.8 % — ABNORMAL LOW (ref 36.0–46.0)
Hemoglobin: 11.3 g/dL — ABNORMAL LOW (ref 12.0–15.0)
MCH: 30.1 pg (ref 26.0–34.0)
MCHC: 31.6 g/dL (ref 30.0–36.0)
MCV: 95.2 fL (ref 80.0–100.0)
Platelets: 208 10*3/uL (ref 150–400)
RBC: 3.76 MIL/uL — ABNORMAL LOW (ref 3.87–5.11)
RDW: 13.9 % (ref 11.5–15.5)
WBC: 21.3 10*3/uL — ABNORMAL HIGH (ref 4.0–10.5)
nRBC: 0.1 % (ref 0.0–0.2)

## 2020-06-26 LAB — BLOOD GAS, ARTERIAL
Acid-Base Excess: 10.2 mmol/L — ABNORMAL HIGH (ref 0.0–2.0)
Bicarbonate: 36.1 mmol/L — ABNORMAL HIGH (ref 20.0–28.0)
Drawn by: 60057
FIO2: 60
O2 Saturation: 95.3 %
Patient temperature: 37.4
pCO2 arterial: 70.2 mmHg (ref 32.0–48.0)
pH, Arterial: 7.334 — ABNORMAL LOW (ref 7.350–7.450)
pO2, Arterial: 86.1 mmHg (ref 83.0–108.0)

## 2020-06-26 LAB — BASIC METABOLIC PANEL
Anion gap: 9 (ref 5–15)
BUN: 115 mg/dL — ABNORMAL HIGH (ref 8–23)
CO2: 36 mmol/L — ABNORMAL HIGH (ref 22–32)
Calcium: 8.4 mg/dL — ABNORMAL LOW (ref 8.9–10.3)
Chloride: 100 mmol/L (ref 98–111)
Creatinine, Ser: 1.24 mg/dL — ABNORMAL HIGH (ref 0.44–1.00)
GFR, Estimated: 49 mL/min — ABNORMAL LOW (ref >60)
Glucose, Bld: 111 mg/dL — ABNORMAL HIGH (ref 70–99)
Potassium: 5.6 mmol/L — ABNORMAL HIGH (ref 3.5–5.1)
Sodium: 145 mmol/L (ref 135–145)

## 2020-06-26 LAB — HEPARIN LEVEL (UNFRACTIONATED): Heparin Unfractionated: 0.81 IU/mL — ABNORMAL HIGH (ref 0.30–0.70)

## 2020-06-26 LAB — POTASSIUM
Potassium: 5.6 mmol/L — ABNORMAL HIGH (ref 3.5–5.1)
Potassium: 4.6 mmol/L (ref 3.5–5.1)
Potassium: 4.8 mmol/L (ref 3.5–5.1)

## 2020-06-26 MED ORDER — SODIUM ZIRCONIUM CYCLOSILICATE 10 G PO PACK
10.0000 g | PACK | Freq: Once | ORAL | Status: AC
  Administered 2020-06-26: 10 g via ORAL
  Filled 2020-06-26: qty 1

## 2020-06-26 MED ORDER — APIXABAN 5 MG PO TABS: 5.0000 mg | ORAL_TABLET | Freq: Two times a day (BID) | ORAL | Status: DC

## 2020-06-26 MED ORDER — STERILE WATER FOR INJECTION IJ SOLN
INTRAMUSCULAR | Status: AC
  Filled 2020-06-26: qty 10

## 2020-06-26 MED ORDER — OXYCODONE HCL 5 MG PO TABS
5.0000 mg | ORAL_TABLET | Freq: Three times a day (TID) | ORAL | Status: DC
  Administered 2020-06-26 – 2020-06-28 (×7): 5 mg
  Filled 2020-06-26 (×7): qty 1

## 2020-06-26 MED ORDER — QUETIAPINE FUMARATE 50 MG PO TABS
50.0000 mg | ORAL_TABLET | Freq: Every day | ORAL | Status: DC
  Administered 2020-06-26 – 2020-06-27 (×2): 50 mg
  Filled 2020-06-26 (×2): qty 1

## 2020-06-26 MED ORDER — SODIUM ZIRCONIUM CYCLOSILICATE 10 G PO PACK
10.0000 g | PACK | Freq: Two times a day (BID) | ORAL | Status: DC
  Administered 2020-06-26 – 2020-06-27 (×3): 10 g
  Filled 2020-06-26 (×3): qty 1

## 2020-06-26 MED ORDER — POLYETHYLENE GLYCOL 3350 17 G PO PACK
17.0000 g | PACK | Freq: Two times a day (BID) | ORAL | Status: DC
  Administered 2020-06-26 – 2020-06-28 (×4): 17 g
  Filled 2020-06-26 (×3): qty 1

## 2020-06-26 MED ORDER — FUROSEMIDE 10 MG/ML IJ SOLN
40.0000 mg | Freq: Four times a day (QID) | INTRAMUSCULAR | Status: AC
  Administered 2020-06-26 (×3): 40 mg via INTRAVENOUS
  Filled 2020-06-26 (×3): qty 4

## 2020-06-26 MED ORDER — APIXABAN 5 MG PO TABS
10.0000 mg | ORAL_TABLET | Freq: Two times a day (BID) | ORAL | Status: DC
  Administered 2020-06-26 – 2020-06-28 (×5): 10 mg via ORAL
  Filled 2020-06-26 (×5): qty 2

## 2020-06-26 NOTE — Progress Notes (Signed)
eLink Physician-Brief Progress Note Patient Name: Courtney Simmons DOB: [birth year] MRN: [id number]   Date of Service  06/26/2020  HPI/Events of Note  ABG 7.33/70/86 on VAC 35/370/60%/16 PEEP, peak pressure 34, MV 12  eICU Interventions  Maintain current vent setting     Intervention Category Intermediate Interventions: Diagnostic test evaluation  Darl Pikes 06/26/2020, 5:40 AM

## 2020-06-26 NOTE — Progress Notes (Signed)
Spoke w/ pts dtr-in-law French Ana to provide updates. Spoke w/ e-link to set up family visit. French Ana appreciative.

## 2020-06-26 NOTE — Progress Notes (Signed)
ANTICOAGULATION CONSULT NOTE  Pharmacy Consult: Heparin Indication: DVT  No Known Allergies  Patient Measurements: Height: 5\' 7"  (170.2 cm) Weight: (!) 140.6 kg (309 lb 15.5 oz) IBW/kg (Calculated) : 61.6 Heparin Dosing Weight: 93 kg  Vital Signs: Temp: 98 F (36.7 C) (01/18 0400) Temp Source: Oral (01/18 0400) BP: 110/61 (01/18 0500) Pulse Rate: 75 (01/18 0500)  Labs: Recent Labs    06/24/20 0430 06/24/20 1111 06/24/20 1755 06/25/20 0243 06/26/20 0429  HGB 11.4* 11.2*  --  12.2 11.3*  HCT 36.2 33.0*  --  Courtney.9 35.8*  PLT 191  --   --  201 208  HEPARINUNFRC 0.60  --  0.39 0.68 0.81*  CREATININE 1.37*  --   --  1.30* 1.24*    Estimated Creatinine Clearance: 68.3 mL/min (A) (by C-G formula based on SCr of 1.24 mg/dL (H)).   Assessment: Courtney Simmons with Covid PNA and new DVT to continue on IV heparin.  Noted platelet fall from 300s on admit to 140s on 1/14, calculated 4T score is 5-6 indicating intermediate to high chance of HITT, with the possibility of new DVT. MD contacted and decision was made to order HIT antibody while continuing heparin. HIT antibody now resulted negative.   1/18 AM update:  Heparin level high Had been trending up  Goal of Therapy:  Heparin level 0.3-0.7 units/ml Monitor platelets by anticoagulation protocol: Yes   Plan:  Dec heparin to 1000 units/hr Re-check heparin level in 8 hours Monitor closely for s/sx of bleeding  2/18, PharmD, BCPS Clinical Pharmacist Phone: [phone]

## 2020-06-26 NOTE — Progress Notes (Signed)
ANTICOAGULATION CONSULT NOTE - Follow Up Consult  Pharmacy Consult for heparin  Indication: DVT  No Known Allergies  Patient Measurements: Height: 5\' 7"  (170.2 cm) Weight: (!) 140.6 kg (309 lb 15.5 oz) IBW/kg (Calculated) : 61.6 Heparin Dosing Weight: 93 kg  Vital Signs: Temp: 97.9 F (36.6 C) (01/18 0930) Temp Source: Axillary (01/18 0930) BP: 111/62 (01/18 0930) Pulse Rate: 66 (01/18 0930)  Labs: Recent Labs    06/24/20 0430 06/24/20 1111 06/24/20 1755 06/25/20 0243 06/26/20 0429  HGB 11.4* 11.2*  --  12.2 11.3*  HCT 36.2 33.0*  --  38.9 35.8*  PLT 191  --   --  201 208  HEPARINUNFRC 0.60  --  0.39 0.68 0.81*  CREATININE 1.37*  --   --  1.30* 1.24*    Estimated Creatinine Clearance: 68.3 mL/min (A) (by C-G formula based on SCr of 1.24 mg/dL (H)).  Assessment: 65 y.o. female COVID positive with DVT.  Noted platelet fall from 300s on admit to 140s on 1/14, calculated 4T score is 5-6 indicating intermediate to high chance of HITT, with the possibility of new DVT. MD contacted and decision was made to order HIT antibody while continuing heparin. HIT antibody now resulted negative.  Minimal bleeding on 1/17, however is stable and not worsening per MD.  Patient has a Cortrack in place.   Goal of Therapy:  Monitor platelets by anticoagulation protocol: Yes   Plan:  Discontinue heparin drip. Start apixaban 10 mg BID x 7 days, followed by 5 mg BID thereafter. Administer apixaban at time of heparin discontinuation.  Monitor s/sx of new or worsening bleed. CBC and BMP daily.  2/17, PharmD Student 06/26/2020,9:44 AM

## 2020-06-26 NOTE — Progress Notes (Addendum)
NAME:  Nashayla Telleria, MRN:  [id number], DOB:  1956/04/27, LOS: 10 ADMISSION DATE:  06/23/2020, CONSULTATION DATE: 06/18/19 REFERRING MD:  Vanetta Mulders, CHIEF COMPLAINT:  Shortness of breath/respiratory failure.   Brief History:  65 yo woman with obesity hypothyroism, COVID PNA, intubated  Past Medical History:  Hypothyroidism HLD Nephrolithiasis obesity Hx of back surgery, breast biopsy, ankle surgery Home meds: prednisone taper 9/21, vitamin D, zocor, levoxyl, prn advil and tylenol  Significant Hospital Events:  Intubated 1/9, Remdesivir and systemic steroids started. Also got Tocilizumab Proned 1/10, ddimer elevated. Got repeat Toci dose. LE [place] ordered. VT reduced to 6 cc/kg. + Acute RLE DVT involving femoral vein, popliteal, right posterior tib and gastrocnemius, fuill dose heparin started  1/11: getting NMB cont Prone positioning  1/13: Intermittent NMB, episode of hematochezia overnight per nurse report, remains on high vent settings 1/14: Proned overnight. Went into atrial fibrillation, rate controlled.   Consults:    Procedures:  Intubated 1/9 Central line R femoral 1/9  Significant Diagnostic Tests:  CXR 06/17/20 : Bilateral lung opacities with central air bronchograms 06/17/20: D-dimer 3.59, CRP 24, ferritin 938 06/21/20: CRP 3.3, D-dimer 11.9.  Chest x-ray showed stable bilateral opacities  Micro Data:  06-23-2020: Covid + Blood culture 06/23/2020 >> no growth to date x5 days Respiratory culture 1/12>>> normal respiratory flora  Antimicrobials:  Tocilizumab on 1/9 and 1/10 Remdesivir 1/9>>> 1/14 Vancomycin 1/12 Cefepime 1/12>> 1/17  Interim History / Subjective:   Hyperkalemic, given lokelma. No changes to ventilator setting.   Appears comfortable on exam, currently sedated and intubated.   Objective   Blood pressure 106/62, pulse 78, temperature 98 F (36.7 C), temperature source Oral, resp. rate (!) 35, height 5\' 7"  (1.702 m), weight (!) 140.6 kg, SpO2 95  %.    Vent Mode: PRVC FiO2 (%):  [60 %-65 %] 60 % Set Rate:  [35 bmp] 35 bmp Vt Set:  [370 mL] 370 mL PEEP:  [16 cmH20] 16 cmH20 Plateau Pressure:  [28 cmH20-32 cmH20] 30 cmH20   Intake/Output Summary (Last 24 hours) at 06/26/2020 0732 Last data filed at 06/26/2020 0700 Gross per 24 hour  Intake 4223.69 ml  Output 4610 ml  Net -386.31 ml   Filed Weights   06/23/20 0400 06/25/20 0342 06/26/20 0432  Weight: (!) 144.1 kg (!) 140.7 kg (!) 140.6 kg    Examination: General: Obese female, NAD, intubated and on mechanical life support HEENT: /AT, ETT in place Cardiac: RRR, no m/r/g Pulmonary: BL mechanically ventilated breath sounds, no wheezing, rhonchi, or rales Abdomen: Soft, non tender, non-distended Extremity: BL LE swelling, cold left foot, difficult to appreciate pulses in either foot Neuro: Sedated, no response  Resolved Hospital Problem list   Hematochezia  Assessment & Plan:   Acute hypoxic and hypercapnic respiratory failure due to ARDS from Covid pneumonia: Current vent setting at Vt 370, PEEP 14, FiO2 50%. S/p Tocilizumab and remdesivir Continue IV steroids Continue mechanical ventilation per ARDS protocol Target TVol 6-8cc/kgIBW Target Plateau Pressure < 30cm H20, driving pressure less than 15 cm of water Target PaO2 55-65: titrate PEEP/FiO2 per protocol Ventilator associated pneumonia prevention protocol Monitor P/F ratio, if over 150, no need to prone Decrease RASS to -2 to -3 Continue aggressive diuresis, net negative yesterday Procalcitonin has trended down, patient completed 5 days therapy with cefepime  Acute DVT (RLE) Given drop in platelets HIT antibodies were ordered which came back negative. Patient left lower extremity cold to touch, difficult to appreciate pulses. Checking with dopplers.  Patient already on IV heparin.  Switch from IV heparin to apixiban  New onset paroxysmal atrial fibrillation: Heart rate remains well controlled. Currently in  NSR.  Continue heparin infusion for stroke prophylaxis  AKI: Hyperkalemia Serum creatinine continues to improve Follow I/O's Follow BMP Received lokelma this morning for K 4.6, repeat BMP in AM  Hyperglycemia: CBGS + SSI + Levemir   Hypothyroidism:  Continue levothyroxine   Best practice (evaluated daily)  Diet: tube feeds  Pain/Anxiety/Delirium protocol (if indicated): fentanyl, versed PRN NMB  VAP protocol (if indicated): yes DVT prophylaxis: heparin gtt started 1/10 GI prophylaxis: protonix Glucose control: SSI and levemir  Mobility: Bedrest for now Disposition:ICU  Goals of Care:  Last date of multidisciplinary goals of care discussion: 1/16 Family and staff present:With patients sister-inlaw tracy via phone Summary of discussion: Continue full aggressive care Follow up goals of care discussion due: 1/23 Code Status: Full Code   Claudean Severance, M.D. PGY3 06/26/2020 7:50 AM

## 2020-06-26 NOTE — Progress Notes (Signed)
Assisted family with tele-visit via elink 

## 2020-06-26 NOTE — TOC Benefit Eligibility Note (Signed)
Transition of Care [facility]) Benefit Eligibility Note    Patient Details  Name: Courtney Simmons MRN: [id number] Date of Birth: 14-Jul-1955   Medication/Dose: Lesia Hausen 15mg .bid for 21 days then20mg . once daily and Eliquis 5mg  for 30 day supply bid.(Eliquis only comes in 2.5mg  and 5mg .)  Covered?: Yes  Tier: 3 Drug  Prescription Coverage [facility]erred [facility]acy: [facility]  Spoke with Person/Company/Phone Number:: . [facility] [phone]  Co-Pay: $30.00  Prior Approval: No  Deductible:  (No Deductible)       [phone] Phone Number: 06/26/2020, 4:12 PM

## 2020-06-26 NOTE — Progress Notes (Signed)
eLink Physician-Brief Progress Note Patient Name: Courtney Simmons DOB: March 10, 1956 MRN: [id number]   Date of Service  06/26/2020  HPI/Events of Note  Notified of K 5.6 Creatinine 1.2 improving Given Lokelma for K of 5.4 yesterday  eICU Interventions  Ordered another dose of Lokelma and monitor K     Intervention Category Major Interventions: Electrolyte abnormality - evaluation and management  Darl Pikes 06/26/2020, 6:58 AM

## 2020-06-27 ENCOUNTER — Inpatient Hospital Stay (HOSPITAL_COMMUNITY): Payer: BC Managed Care – PPO

## 2020-06-27 DIAGNOSIS — N179 Acute kidney failure, unspecified: Secondary | ICD-10-CM | POA: Diagnosis not present

## 2020-06-27 DIAGNOSIS — J80 Acute respiratory distress syndrome: Secondary | ICD-10-CM | POA: Diagnosis not present

## 2020-06-27 DIAGNOSIS — J9600 Acute respiratory failure, unspecified whether with hypoxia or hypercapnia: Secondary | ICD-10-CM

## 2020-06-27 DIAGNOSIS — U071 COVID-19: Secondary | ICD-10-CM | POA: Diagnosis not present

## 2020-06-27 LAB — BASIC METABOLIC PANEL
Anion gap: 7 (ref 5–15)
BUN: 113 mg/dL — ABNORMAL HIGH (ref 8–23)
CO2: 36 mmol/L — ABNORMAL HIGH (ref 22–32)
Calcium: 8 mg/dL — ABNORMAL LOW (ref 8.9–10.3)
Chloride: 101 mmol/L (ref 98–111)
Creatinine, Ser: 1.08 mg/dL — ABNORMAL HIGH (ref 0.44–1.00)
GFR, Estimated: 58 mL/min — ABNORMAL LOW (ref >60)
Glucose, Bld: 160 mg/dL — ABNORMAL HIGH (ref 70–99)
Potassium: 5.2 mmol/L — ABNORMAL HIGH (ref 3.5–5.1)
Sodium: 144 mmol/L (ref 135–145)

## 2020-06-27 LAB — GLUCOSE, CAPILLARY
Glucose-Capillary: 158 mg/dL — ABNORMAL HIGH (ref 70–99)
Glucose-Capillary: 138 mg/dL — ABNORMAL HIGH (ref 70–99)
Glucose-Capillary: 161 mg/dL — ABNORMAL HIGH (ref 70–99)
Glucose-Capillary: 223 mg/dL — ABNORMAL HIGH (ref 70–99)
Glucose-Capillary: 281 mg/dL — ABNORMAL HIGH (ref 70–99)

## 2020-06-27 LAB — BLOOD GAS, ARTERIAL
Acid-Base Excess: 10 mmol/L — ABNORMAL HIGH (ref 0.0–2.0)
Bicarbonate: 36.7 mmol/L — ABNORMAL HIGH (ref 20.0–28.0)
Drawn by: 60057
FIO2: 80
O2 Saturation: 88.5 %
Patient temperature: 37.2
pCO2 arterial: 80.2 mmHg (ref 32.0–48.0)
pH, Arterial: 7.284 — ABNORMAL LOW (ref 7.350–7.450)
pO2, Arterial: 66.9 mmHg — ABNORMAL LOW (ref 83.0–108.0)

## 2020-06-27 LAB — CBC
HCT: 36.4 % (ref 36.0–46.0)
Hemoglobin: 10.7 g/dL — ABNORMAL LOW (ref 12.0–15.0)
MCH: 29.2 pg (ref 26.0–34.0)
MCHC: 29.4 g/dL — ABNORMAL LOW (ref 30.0–36.0)
MCV: 99.2 fL (ref 80.0–100.0)
Platelets: 187 10*3/uL (ref 150–400)
RBC: 3.67 MIL/uL — ABNORMAL LOW (ref 3.87–5.11)
RDW: 14 % (ref 11.5–15.5)
WBC: 21.1 10*3/uL — ABNORMAL HIGH (ref 4.0–10.5)
nRBC: 0.1 % (ref 0.0–0.2)

## 2020-06-27 LAB — PHOSPHORUS: Phosphorus: 6.5 mg/dL — ABNORMAL HIGH (ref 2.5–4.6)

## 2020-06-27 LAB — MAGNESIUM: Magnesium: 3.1 mg/dL — ABNORMAL HIGH (ref 1.7–2.4)

## 2020-06-27 MED ORDER — METOCLOPRAMIDE HCL 10 MG PO TABS
10.0000 mg | ORAL_TABLET | Freq: Three times a day (TID) | ORAL | Status: DC
  Administered 2020-06-27 – 2020-06-28 (×5): 10 mg
  Filled 2020-06-27 (×6): qty 1

## 2020-06-27 MED ORDER — METHYLPREDNISOLONE SODIUM SUCC 40 MG IJ SOLR
20.0000 mg | INTRAMUSCULAR | Status: AC
  Administered 2020-06-27 – 2020-06-28 (×2): 20 mg via INTRAVENOUS
  Filled 2020-06-27 (×2): qty 1

## 2020-06-27 MED ORDER — FUROSEMIDE 10 MG/ML IJ SOLN
40.0000 mg | Freq: Four times a day (QID) | INTRAMUSCULAR | Status: DC
  Administered 2020-06-27 (×3): 40 mg via INTRAVENOUS
  Filled 2020-06-27 (×3): qty 4

## 2020-06-27 NOTE — Progress Notes (Signed)
Spoke w/ pts dtr-in-law Tracy to provide updates. Tracy appreciative.  

## 2020-06-27 NOTE — Progress Notes (Signed)
NAME:  Courtney Simmons, MRN:  [id number], DOB:  08-15-1955, LOS: 11 ADMISSION DATE:  06/09/2020, CONSULTATION DATE: 06/18/19 REFERRING MD:  Vanetta Mulders, CHIEF COMPLAINT:  Shortness of breath/respiratory failure.   Brief History:  65 yo woman with obesity hypothyroism, COVID PNA, intubated  Past Medical History:  Hypothyroidism HLD Nephrolithiasis obesity Hx of back surgery, breast biopsy, ankle surgery Home meds: prednisone taper 9/21, vitamin D, zocor, levoxyl, prn advil and tylenol  Significant Hospital Events:  Intubated 1/9, Remdesivir and systemic steroids started. Also got Tocilizumab Proned 1/10, ddimer elevated. Got repeat Toci dose. LE [place] ordered. VT reduced to 6 cc/kg. + Acute RLE DVT involving femoral vein, popliteal, right posterior tib and gastrocnemius, fuill dose heparin started  1/11: getting NMB cont Prone positioning  1/13: Intermittent NMB, episode of hematochezia overnight per nurse report, remains on high vent settings 1/14: Proned overnight. Went into atrial fibrillation, rate controlled.   Consults:   Procedures:  Intubated 1/9 Central line R femoral 1/9  Significant Diagnostic Tests:  CXR 06/17/20 : Bilateral lung opacities with central air bronchograms 06/17/20: D-dimer 3.59, CRP 24, ferritin 938 06/21/20: CRP 3.3, D-dimer 11.9.  Chest x-ray showed stable bilateral opacities  Micro Data:  06/10/2020: Covid + Blood culture 06/21/2020 >> no growth to date x5 days Respiratory culture 1/12>>> normal respiratory flora  Antimicrobials:  Tocilizumab on 1/9 and 1/10 Remdesivir 1/9>>> 1/14 Vancomycin 1/12 Cefepime 1/12>> 1/17  Interim History / Subjective:   Increased FiO2 overnight to 80%. Sedated and intubated.  Objective   Blood pressure 137/74, pulse 80, temperature 99 F (37.2 C), temperature source Oral, resp. rate 14, height 5\' 7"  (1.702 m), weight (!) 140.2 kg, SpO2 90 %.    Vent Mode: PRVC FiO2 (%):  [45 %-80 %] 80 % Set Rate:  [35 bmp] 35  bmp Vt Set:  [337 mL-370 mL] 337 mL PEEP:  [14 cmH20-16 cmH20] 14 cmH20 Plateau Pressure:  [18 cmH20-32 cmH20] 18 cmH20   Intake/Output Summary (Last 24 hours) at 06/27/2020 0718 Last data filed at 06/27/2020 0600 Gross per 24 hour  Intake 1933.51 ml  Output 3600 ml  Net -1666.49 ml   Filed Weights   06/25/20 0342 06/26/20 0432 06/27/20 0500  Weight: (!) 140.7 kg (!) 140.6 kg (!) 140.2 kg    Examination: General: Middle aged obese female, NAD, intubated and on mechanical life support HEENT: [place]/AT, ETT in place Cardiac: RRR, no m/r/g Pulmonary: BL mechanically ventilated breath sounds, no wheezing, rhonchi, or rales Abdomen: Soft, non tender, non-distended Extremity: BL LE swelling, cold bilateral feet Neuro: Sedated, unreponsive  Resolved Hospital Problem list   Hematochezia  Assessment & Plan:   Acute hypoxic and hypercapnic respiratory failure due to ARDS from Covid pneumonia: Current vent setting at Vt 337, PEEP 14, FiO2 80%. ABG this morning showed pH 7.28, CO2 80.7, O2 66.9%. P/F ratio 82.5. Patient would benefit from proning today.  Overall having difficulty weaning off ventilator, had discussed tracheostomy tube with family, patient may be heading in this direction.  S/p Tocilizumab and remdesivir Continue IV steroids, down to 40 mg daily now Continue mechanical ventilation per ARDS protocol Target TVol 6-8cc/kgIBW Target Plateau Pressure < 30cm H20, driving pressure less than 15 cm of water Target PaO2 55-65: titrate PEEP/FiO2 per protocol Ventilator associated pneumonia prevention protocol Monitor P/F ratio, if over 150, no need to prone RASS to -2 to -3 Continue aggressive diuresis, net negative yesterday Completed 5 days therapy with cefepime  Acute DVT (RLE) Given drop in  platelets HIT antibodies were ordered which came back negative. Patient left lower extremity cold to touch, dopplers noted bilateral pulses. Will monitor for now.  Patient on  apixiban  New onset paroxysmal atrial fibrillation: Heart rate remains well controlled. Currently in NSR.  Continue apixaban for stroke prophylaxis  AKI: Hyperkalemia, hypermagnesemia, hyperphosphatemia, hypocalcemia: Serum creatinine continues to improve. Patient received IV lasix yesterday.  Will repeat lasix dose and repeat labs in AM.  Follow I/O's Follow BMP Repeat Lokelma   Hyperglycemia: CBGS + SSI + Levemir   Hypothyroidism:  Continue levothyroxine   Best practice (evaluated daily)  Diet: tube feeds  Pain/Anxiety/Delirium protocol (if indicated): fentanyl, versed PRN NMB  VAP protocol (if indicated): yes DVT prophylaxis: Apixiban GI prophylaxis: protonix Glucose control: SSI and levemir  Mobility: Bedrest for now Disposition:ICU  Goals of Care:  Last date of multidisciplinary goals of care discussion: 1/18 Family and staff present: Called patients sister-in law tracy via phone Summary of discussion: Continue full aggressive care Follow up goals of care discussion due: 1/23 Code Status: Full Code   Claudean Severance, M.D. [facility] 06/27/2020 7:18 AM

## 2020-06-27 NOTE — Progress Notes (Signed)
Assisted tele visit to patient with family member.  Courtney Mchaney McEachran, RN  

## 2020-06-27 NOTE — Progress Notes (Signed)
pts phos/mag/k all elevated this AM. CCM aware.

## 2020-06-28 DIAGNOSIS — U071 COVID-19: Secondary | ICD-10-CM | POA: Diagnosis not present

## 2020-06-28 DIAGNOSIS — N179 Acute kidney failure, unspecified: Secondary | ICD-10-CM | POA: Diagnosis not present

## 2020-06-28 DIAGNOSIS — J9601 Acute respiratory failure with hypoxia: Secondary | ICD-10-CM | POA: Diagnosis not present

## 2020-06-28 LAB — BASIC METABOLIC PANEL
Anion gap: 9 (ref 5–15)
Anion gap: 7 (ref 5–15)
BUN: 139 mg/dL — ABNORMAL HIGH (ref 8–23)
BUN: 153 mg/dL — ABNORMAL HIGH (ref 8–23)
CO2: 35 mmol/L — ABNORMAL HIGH (ref 22–32)
CO2: 35 mmol/L — ABNORMAL HIGH (ref 22–32)
Calcium: 7.9 mg/dL — ABNORMAL LOW (ref 8.9–10.3)
Calcium: 7.5 mg/dL — ABNORMAL LOW (ref 8.9–10.3)
Chloride: 99 mmol/L (ref 98–111)
Chloride: 103 mmol/L (ref 98–111)
Creatinine, Ser: 1.61 mg/dL — ABNORMAL HIGH (ref 0.44–1.00)
Creatinine, Ser: 1.96 mg/dL — ABNORMAL HIGH (ref 0.44–1.00)
GFR, Estimated: 36 mL/min — ABNORMAL LOW (ref >60)
GFR, Estimated: 28 mL/min — ABNORMAL LOW (ref >60)
Glucose, Bld: 336 mg/dL — ABNORMAL HIGH (ref 70–99)
Glucose, Bld: 237 mg/dL — ABNORMAL HIGH (ref 70–99)
Potassium: 6.5 mmol/L (ref 3.5–5.1)
Potassium: 6.7 mmol/L (ref 3.5–5.1)
Sodium: 143 mmol/L (ref 135–145)
Sodium: 145 mmol/L (ref 135–145)

## 2020-06-28 LAB — CBC
HCT: 30 % — ABNORMAL LOW (ref 36.0–46.0)
Hemoglobin: 8.7 g/dL — ABNORMAL LOW (ref 12.0–15.0)
MCH: 30.1 pg (ref 26.0–34.0)
MCHC: 29 g/dL — ABNORMAL LOW (ref 30.0–36.0)
MCV: 103.8 fL — ABNORMAL HIGH (ref 80.0–100.0)
Platelets: 181 10*3/uL (ref 150–400)
RBC: 2.89 MIL/uL — ABNORMAL LOW (ref 3.87–5.11)
RDW: 13.9 % (ref 11.5–15.5)
WBC: 41.2 10*3/uL — ABNORMAL HIGH (ref 4.0–10.5)
nRBC: 0.1 % (ref 0.0–0.2)

## 2020-06-28 LAB — GLUCOSE, CAPILLARY
Glucose-Capillary: 206 mg/dL — ABNORMAL HIGH (ref 70–99)
Glucose-Capillary: 207 mg/dL — ABNORMAL HIGH (ref 70–99)
Glucose-Capillary: 213 mg/dL — ABNORMAL HIGH (ref 70–99)
Glucose-Capillary: 217 mg/dL — ABNORMAL HIGH (ref 70–99)
Glucose-Capillary: 218 mg/dL — ABNORMAL HIGH (ref 70–99)
Glucose-Capillary: 228 mg/dL — ABNORMAL HIGH (ref 70–99)
Glucose-Capillary: 255 mg/dL — ABNORMAL HIGH (ref 70–99)
Glucose-Capillary: 306 mg/dL — ABNORMAL HIGH (ref 70–99)
Glucose-Capillary: 313 mg/dL — ABNORMAL HIGH (ref 70–99)

## 2020-06-28 MED ORDER — SODIUM ZIRCONIUM CYCLOSILICATE 10 G PO PACK
10.0000 g | PACK | Freq: Three times a day (TID) | ORAL | Status: DC
  Administered 2020-06-28 (×2): 10 g
  Filled 2020-06-28 (×2): qty 1

## 2020-06-28 MED ORDER — DEXTROSE 50 % IV SOLN
1.0000 | Freq: Once | INTRAVENOUS | Status: AC
  Administered 2020-06-28: 50 mL via INTRAVENOUS
  Filled 2020-06-28: qty 50

## 2020-06-28 MED ORDER — SODIUM CHLORIDE (PF) 0.9 % IJ SOLN
8.0000 [IU] | Freq: Once | INTRAMUSCULAR | Status: DC
  Filled 2020-06-28: qty 0.08

## 2020-06-28 MED ORDER — PHENYLEPHRINE HCL-NACL 10-0.9 MG/250ML-% IV SOLN
0.0000 ug/min | INTRAVENOUS | Status: DC
  Administered 2020-06-28: 100 ug/min via INTRAVENOUS
  Administered 2020-06-28: 40 ug/min via INTRAVENOUS
  Administered 2020-06-28: 20 ug/min via INTRAVENOUS
  Filled 2020-06-28 (×3): qty 250

## 2020-06-28 MED ORDER — INSULIN REGULAR(HUMAN) IN NACL 100-0.9 UT/100ML-% IV SOLN
INTRAVENOUS | Status: DC
  Administered 2020-06-28: 9 [IU]/h via INTRAVENOUS
  Filled 2020-06-28: qty 100

## 2020-06-28 MED ORDER — PIPERACILLIN-TAZOBACTAM 3.375 G IVPB
3.3750 g | Freq: Three times a day (TID) | INTRAVENOUS | Status: DC
  Administered 2020-06-28: 3.375 g via INTRAVENOUS
  Filled 2020-06-28: qty 50

## 2020-06-28 MED ORDER — INSULIN ASPART 100 UNIT/ML IV SOLN
8.0000 [IU] | Freq: Once | INTRAVENOUS | Status: AC
  Administered 2020-06-28: 8 [IU] via INTRAVENOUS

## 2020-06-28 MED ORDER — DEXTROSE 50 % IV SOLN: 0.0000 mL | INTRAVENOUS | Status: DC | PRN

## 2020-06-28 MED ORDER — CALCIUM GLUCONATE-NACL 1-0.675 GM/50ML-% IV SOLN
1.0000 g | Freq: Once | INTRAVENOUS | Status: AC
  Administered 2020-06-28: 1000 mg via INTRAVENOUS
  Filled 2020-06-28: qty 50

## 2020-06-28 MED ORDER — PANTOPRAZOLE SODIUM 40 MG IV SOLR
40.0000 mg | Freq: Two times a day (BID) | INTRAVENOUS | Status: DC
  Administered 2020-06-28: 40 mg via INTRAVENOUS
  Filled 2020-06-28: qty 40

## 2020-06-28 MED ORDER — PHENYLEPHRINE CONCENTRATED 100MG/250ML (0.4 MG/ML) INFUSION SIMPLE
0.0000 ug/min | INTRAVENOUS | Status: DC
  Administered 2020-06-28: 160 ug/min via INTRAVENOUS
  Filled 2020-06-28: qty 250

## 2020-06-28 MED ORDER — FENTANYL BOLUS VIA INFUSION
50.0000 ug | INTRAVENOUS | Status: DC | PRN
Start: 2020-06-28 — End: 2020-06-29
  Filled 2020-06-28: qty 50

## 2020-07-03 ENCOUNTER — Telehealth: Payer: Self-pay | Admitting: Family

## 2020-07-10 NOTE — Progress Notes (Signed)
One blanket noted to be in the room and was the only item in the room that belonged to the patient per the family. Blanket sent home with family.

## 2020-07-10 NOTE — Progress Notes (Signed)
Pt.'s necklace noted to still be in room and sent down with the patient's body to the morgue.

## 2020-07-10 NOTE — Progress Notes (Signed)
[facility] notified of plan to move to comfort care. Referral # [id number]. Lequita Halt RN at bedside made aware.

## 2020-07-10 NOTE — Progress Notes (Signed)
Pt with cardiac time of death September 27, 1802 and pronounced by Dwyane Luo, RN & Mason Jim, RN. Bilateral breath sounds and heart sounds absent. Family at bedside as patient passed. MD notified.

## 2020-07-10 NOTE — Progress Notes (Signed)
NAME:  Courtney Simmons, MRN:  [id number], DOB:  1956/01/04, LOS: 12 ADMISSION DATE:  2020/07/11, CONSULTATION DATE: 06/18/19 REFERRING MD:  Vanetta Mulders, CHIEF COMPLAINT:  Shortness of breath/respiratory failure.   Brief History:  66 yo woman with obesity hypothyroism, COVID PNA, intubated  Past Medical History:  Hypothyroidism HLD Nephrolithiasis obesity Hx of back surgery, breast biopsy, ankle surgery Home meds: prednisone taper 9/21, vitamin D, zocor, levoxyl, prn advil and tylenol  Significant Hospital Events:  Intubated 1/9, Remdesivir and systemic steroids started. Also got Tocilizumab Proned 1/10, ddimer elevated. Got repeat Toci dose. LE [place] ordered. VT reduced to 6 cc/kg. + Acute RLE DVT involving femoral vein, popliteal, right posterior tib and gastrocnemius, fuill dose heparin started  1/11: getting NMB cont Prone positioning  1/13: Intermittent NMB, episode of hematochezia overnight per nurse report, remains on high vent settings 1/14: Proned overnight. Went into atrial fibrillation, rate controlled.   Consults:    Procedures:  Intubated 1/9 Central line R femoral 1/9  Significant Diagnostic Tests:  CXR 06/17/20 : Bilateral lung opacities with central air bronchograms 06/17/20: D-dimer 3.59, CRP 24, ferritin 938 06/21/20: CRP 3.3, D-dimer 11.9.  Chest x-ray showed stable bilateral opacities  Micro Data:  July 11, 2020: Covid + Blood culture 07/11/20 >> no growth to date x5 days Respiratory culture 1/12>>> normal respiratory flora MRSA PCR 1/14 negative respiratory culture 1/20 >>  Antimicrobials:  Tocilizumab on 1/9 and 1/10 Remdesivir 1/9>>> 1/14 Vancomycin 1/12 Cefepime 1/12>> 1/17 Zosyn 1/20 >  Interim History / Subjective:  Patient urine output has decreased significantly, developed hyperkalemia to 6.5 and worsening AKI. Her white count trended up to 40 K. She has been on 100% FiO2 with 14 of PEEP but O2 sat remain in high 70s to 80  Objective   Blood  pressure (!) 88/50, pulse 98, temperature 98.7 F (37.1 C), temperature source Axillary, resp. rate (!) 35, height 5\' 7"  (1.702 m), weight (!) 139.2 kg, SpO2 (!) 83 %.    Vent Mode: PRVC FiO2 (%):  [80 %-100 %] 100 % Set Rate:  [35 bmp] 35 bmp Vt Set:  [370 mL] 370 mL PEEP:  [14 cmH20] 14 cmH20 Plateau Pressure:  [18 cmH20-28 cmH20] 28 cmH20   Intake/Output Summary (Last 24 hours) at 06/21/2020 1315 Last data filed at 06/09/2020 1200 Gross per 24 hour  Intake 3492.03 ml  Output 1955 ml  Net 1537.03 ml   Filed Weights   06/26/20 0432 06/27/20 0500 06/21/2020 0500  Weight: (!) 140.6 kg (!) 140.2 kg (!) 139.2 kg    Examination: General: Middle-aged obese female, orally intubated HEENT: [place]/AT, ETT/OGT in place Cardiac: RRR, no m/r/g Pulmonary: BL mechanically ventilated breath sounds, no wheezing, rhonchi, or rales Abdomen: Soft, non tender, non-distended Extremity: BL LE swelling, cold left foot, difficult to appreciate pulses in either foot Neuro: Deeply sedated, not following commands  Resolved Hospital Problem list   Hematochezia  Assessment & Plan:   Acute hypoxic and hypercapnic respiratory failure due to ARDS from Covid pneumonia: Septic shock due to possible HCAP Continue lung protective ventilation, currently on FiO2 100% and PEEP of 14 S/p Tocilizumab and remdesivir decrease IV Solu-Medrol to 20 mg once daily Target TVol 6cc/kgIBW peak, plateau and driving pressures remain elevated about the goal Target PaO2 55-65: titrate PEEP/FiO2 per protocol Ventilator associated pneumonia prevention protocol RASS goal -2 to -3 holding diuresis due to worsening kidney function patient's white count started trending up, she is getting hypotensive requiring IV vasopressors respiratory culture is repeated  started on IV Zosyn  Acute DVT (RLE) Given drop in platelets HIT antibodies were ordered which came back negative. IV heparin was switched to apixaban but patient started  having melena holding anticoagulation for now  Melena/upper GI bleeding acute blood loss anemia patient started with dark stools since this morning hemoglobin dropped from 10-8 apixaban was stopped started on IV Protonix twice daily monitor H&H with a goal hemoglobin 7 - 8  New onset paroxysmal atrial fibrillation: Heart rate remains well controlled. Currently in NSR.  Holding anticoagulation due to GI bleeding  Worsening AKI: Hyperkalemia patient serum creatinine started getting worse her urine output has decreased stopped IV diuretics Follow I/O's Follow BMP patient received IV calcium gluconate, D50 and insulin continue Lokelma repeat BMP later in the day  Hyperglycemia: CBGS + SSI + Levemir   Hypothyroidism:  Continue levothyroxine   Best practice (evaluated daily)  Diet: tube feeds  Pain/Anxiety/Delirium protocol (if indicated): fentanyl, versed PRN NMB  VAP protocol (if indicated): yes DVT prophylaxis: SCD GI prophylaxis: protonix Glucose control: SSI and levemir  Mobility: Bedrest for now Disposition:ICU  Goals of Care:  Last date of multidisciplinary goals of care discussion: 1/20 Family and staff present:With patients sister-inlaw tracy via phone explained patient's worsening condition with refractory hypoxemia and shock Summary of discussion: Continue full aggressive care Follow up goals of care discussion due: 1/27 Code Status: Full Code    Total critical care time: 46 minutes  Performed by: Cheri Fowler   Critical care time was exclusive of separately billable procedures and treating other patients.   Critical care was necessary to treat or prevent imminent or life-threatening deterioration.   Critical care was time spent personally by me on the following activities: development of treatment plan with patient and/or surrogate as well as nursing, discussions with consultants, evaluation of patient's response to treatment, examination of patient,  obtaining history from patient or surrogate, ordering and performing treatments and interventions, ordering and review of laboratory studies, ordering and review of radiographic studies, pulse oximetry and re-evaluation of patient's condition.   Cheri Fowler MD [facility] Pager: [phone] Mobile: [phone]

## 2020-07-10 NOTE — Progress Notes (Signed)
eLink Physician-Brief Progress Note Patient Name: Courtney Simmons DOB: Jul 20, 1955 MRN: [id number]   Date of Service  06/21/2020  HPI/Events of Note  Hypotension. Patient is being aggressively diuresed for hypoxemic respiratory failure with a positive fluid balance.  eICU Interventions  Phenylephrine infusion ordered to maintain Map > 65 mmHg.        Thomasene Lot Ogan 07/08/2020, 1:37 AM

## 2020-07-10 NOTE — Death Summary Note (Signed)
DEATH SUMMARY   Patient Details  Name: Courtney Simmons MRN: [id number] DOB: December 15, 1955  Admission/Discharge Information   Admit Date:  07/02/2020  Date of Death: Date of Death: 06/22/2020  Time of Death: Time of Death: 1804  Length of Stay: 12  Referring Physician: Junie SpencerHawks, Christy A, FNP   Reason(s) for Hospitalization  Acute hypoxic/hypercapnic respiratory failure due to ARDS from COVID-19 pneumonia Septic shock due to healthcare associated pneumonia Acute right leg DVT Upper GI bleeding Acute blood loss anemia New onset proximal A. fib Worsening acute kidney injury Hyperkalemia Diagnoses  Preliminary cause of death: Acute hypoxic/hypercapnic respiratory failure due to ARDS from COVID-19 pneumonia and septic shock Secondary Diagnoses (including complications and co-morbidities):  Principal Problem:   Pneumonia due to COVID-19 virus Active Problems:   Hypothyroidism   Morbid obesity (HCC)   AKI (acute kidney injury) (HCC)   Acute respiratory failure with hypoxia [facility](HCC)   Brief Hospital Course (including significant findings, care, treatment, and services provided and events leading to death)  Courtney Simmons is a 65 y.o. year old female who was admitted with acute hypoxic/hypercapnic respiratory failure due to ARDS from COVID-19 pneumonia, she was admitted to ICU, slowly she started getting worse her oxygenation was not improving.  She developed AKI and septic shock requiring vasopressors and multiple antibiotics.  Despite that patient condition is started getting worse, family was at bedside considering her worsening oxygenation and multiorgan failure, they decided to proceed with comfort care option.  Patient was palliatively extubated on 07/04/2020 and was declared dead at 6:04 PM.    Pertinent Labs and Studies  Significant Diagnostic Studies DG Chest 1V REPEAT Same Day  Result Date: 06/17/2020 CLINICAL DATA:  Coronavirus infection.  Intubated. EXAM: CHEST - 1 VIEW SAME  DAY COMPARISON:  06/05/2021 FINDINGS: Endotracheal tube tip 5 cm above the carina. Worsening of widespread pulmonary density consistent with viral pneumonia/ARDS. No lobar collapse. No effusion. IMPRESSION: 1. Endotracheal tube tip 5 cm above the carina. 2. Worsening of widespread pulmonary density consistent with viral pneumonia/ARDS. Electronically Signed   By: Paulina FusiMark  Shogry M.D.   On: 06/17/2020 14:18   DG CHEST PORT 1 VIEW  Result Date: 06/27/2020 CLINICAL DATA:  COVID positive, acute respiratory failure EXAM: PORTABLE CHEST 1 VIEW COMPARISON:  06/23/2020 chest radiograph. FINDINGS: Endotracheal tube tip is 2.9 cm above the carina. Enteric tube enters stomach with the tip not seen on this image. Right PICC terminates at the cavoatrial junction. Stable cardiomediastinal silhouette with normal heart size. No pneumothorax. No pleural effusion. Moderate to severe patchy opacities throughout both lungs, most prominent in the lower lungs, overall minimally improved. IMPRESSION: 1. Well-positioned support structures.  No pneumothorax. 2. Moderate to severe patchy opacities throughout both lungs, overall minimally improved, compatible with COVID-19 pneumonia. Electronically Signed   By: Delbert PhenixJason A Poff M.D.   On: 06/27/2020 08:37   DG CHEST PORT 1 VIEW  Result Date: 06/23/2020 CLINICAL DATA:  Intubated EXAM: PORTABLE CHEST 1 VIEW COMPARISON:  Chest radiograph from one day prior. FINDINGS: Endotracheal tube tip is 4.4 cm above the carina. Enteric tube enters stomach with the tip not seen on this image. Right PICC terminates over the cavoatrial junction. Stable cardiomediastinal silhouette with top-normal heart size. No pneumothorax. No pleural effusion. Severe patchy opacities throughout both lungs, most prominent in the lower lungs, similar. IMPRESSION: 1. Well-positioned support structures. 2. Stable severe patchy opacities throughout both lungs, most prominent in the lower lungs, compatible with multifocal  pneumonia. Electronically Signed   By:  Delbert PhenixJason A Poff M.D.   On: 06/23/2020 08:21   DG CHEST PORT 1 VIEW  Result Date: 06/22/2020 CLINICAL DATA:  Followup ventilator support EXAM: PORTABLE CHEST 1 VIEW COMPARISON:  06/22/2020 FINDINGS: Endotracheal tube tip 5 cm above the carina. Soft feeding tube enters the abdomen. Widespread pulmonary density persists, with consolidation most pronounced in the lower lobes. No worsening or other new finding. IMPRESSION: Endotracheal tube tip 5 cm above the carina. Persistent widespread pulmonary density with consolidation most pronounced in the lower lobes. Electronically Signed   By: Paulina FusiMark  Shogry M.D.   On: 06/22/2020 14:23   DG CHEST PORT 1 VIEW  Result Date: 06/22/2020 CLINICAL DATA:  Intubation. EXAM: PORTABLE CHEST 1 VIEW COMPARISON:  Single-view of the chest 06/21/2020. FINDINGS: Endotracheal tube seen on yesterday's examination is no longer present. No ET tube is identified. Right PICC is unchanged. Feeding tube courses into the stomach and below the inferior margin the film. Bilateral airspace disease is worst in the bases and unchanged. IMPRESSION: No endotracheal tube is identified. Right PICC and feeding tube appear unchanged. N of o change in bilateral airspace disease. Electronically Signed   By: Drusilla Kannerhomas  Dalessio M.D.   On: 06/22/2020 12:56   DG Chest Port 1 View  Result Date: 06/21/2020 CLINICAL DATA:  ARDS EXAM: PORTABLE CHEST 1 VIEW COMPARISON:  06/20/2020, 06/19/2020 FINDINGS: *Endotracheal tube tip terminates in the mid to lower trachea, 2.4 cm from the carina. *Transesophageal feeding tube tip terminates below the margins of imaging. *Right upper extremity PICC tip terminates near the superior cavoatrial junction. *Telemetry leads and external support devices overlie the chest. Redemonstration of the heterogeneous basilar predominant opacities in both lungs. No pneumothorax. No pneumothorax or effusion. Prominent cardiac silhouette is similar to prior  with a calcified aorta. No acute osseous or soft tissue abnormality. IMPRESSION: 1. Slightly low positioning of the endotracheal tube. Could consider retraction 2 cm to the mid trachea. 2. Otherwise satisfactory positioning of lines and tubes. 3. Stable lower lung predominant opacities. Electronically Signed   By: Kreg ShropshirePrice  DeHay M.D.   On: 06/21/2020 05:03   DG CHEST PORT 1 VIEW  Result Date: 06/20/2020 CLINICAL DATA:  Hip axial. EXAM: PORTABLE CHEST 1 VIEW COMPARISON:  Radiograph yesterday. FINDINGS: Endotracheal tube tip 3.2 cm from the carina. Enteric tube in place with tip below the diaphragm not included in the field of view. Improvement in bilateral lung opacities from prior exam in the upper lung zones. Opacities persist greatest at the bases. Heart size upper normal. Stable mediastinal contours. No evidence of pneumomediastinum or pneumothorax stable osseous structures. IMPRESSION: 1. Improvement in bilateral lung opacities in the upper lung zones from prior exam. 2. Endotracheal and enteric tubes remain in place. Electronically Signed   By: Narda RutherfordMelanie  Sanford M.D.   On: 06/20/2020 19:35   DG Chest Port 1 View  Result Date: 06/19/2020 CLINICAL DATA:  Intubation EXAM: PORTABLE CHEST 1 VIEW COMPARISON:  06/19/2020 at 10:29 a.m. FINDINGS: Unchanged position of endotracheal and orogastric tubes. Diffuse airspace opacities throughout both lungs have worsened. IMPRESSION: 1. Unchanged position of support apparatus. 2. Worsened diffuse airspace opacities throughout both lungs. Electronically Signed   By: Deatra RobinsonKevin  Herman M.D.   On: 06/19/2020 19:31   DG CHEST PORT 1 VIEW  Result Date: 06/19/2020 CLINICAL DATA:  Hypoxia.  COVID pneumonia. EXAM: PORTABLE CHEST 1 VIEW COMPARISON:  06/17/2020. FINDINGS: The tracheostomy tube tip is above the carina. There is a nasogastric tube with tip below the level of the GE junction.  Interval improvement in bilateral lung opacities. No pleural effusion or pneumothorax  identified. IMPRESSION: Interval improvement in aeration a lungs compared with previous exam. Electronically Signed   By: Signa Kell M.D.   On: 06/19/2020 10:52   DG Chest Port 1 View  Result Date: 06/17/2020 CLINICAL DATA:  Shortness of breath.  Hypoxia. EXAM: PORTABLE CHEST 1 VIEW COMPARISON:  Earlier same day. FINDINGS: Endotracheal tube 2.5 cm above the carina. Nasogastric tube enters the abdomen. Widespread pulmonary opacity and consolidation with air bronchograms. Findings consistent with diffuse pneumonia/ARDS. No qualitatively new finding. IMPRESSION: Endotracheal tube and nasogastric tube well positioned. Widespread pneumonia/ARDS pattern, similar to prior. Electronically Signed   By: Paulina Fusi M.D.   On: 06/17/2020 18:42   DG Chest Portable 1 View  Result Date: 06/17/2020 CLINICAL DATA:  OG tube placement. EXAM: PORTABLE CHEST 1 VIEW COMPARISON:  Radiograph earlier today. FINDINGS: Tip of the enteric tube is below the diaphragm likely in the stomach. The side-port is not definitively visualized but suspected below the diaphragm. Endotracheal tube tip is at the level of the clavicular heads, partially obscured on the current exam. Patient is rotated. Lung volumes are low. Progressive bilateral lung opacities with central air bronchograms. Stable heart size. No pneumothorax or evidence of pneumomediastinum. IMPRESSION: 1. Tip of the enteric tube below the diaphragm likely in the stomach. The side-port is not definitively visualized but suspected below the diaphragm. 2. Endotracheal tube tip at the level of the clavicular heads. 3. Progressive bilateral lung opacities with central air bronchograms, progressive pneumonia versus developing ARDS. Electronically Signed   By: Narda Rutherford M.D.   On: 06/17/2020 15:06   DG Chest Portable 1 View  Result Date: 06/30/2020 CLINICAL DATA:  Hypoxia.  Lethargy. EXAM: PORTABLE CHEST 1 VIEW COMPARISON:  None. FINDINGS: Diffuse bilateral pulmonary  infiltrates, right greater than left. No pneumothorax. Possible cardiomegaly. The hila and mediastinum are unremarkable. IMPRESSION: Diffuse bilateral pulmonary infiltrates, right greater than left, worrisome for multifocal pneumonia. Electronically Signed   By: Gerome Sam III M.D   On: 06/25/2020 15:55   ECHOCARDIOGRAM COMPLETE  Result Date: 06/18/2020    ECHOCARDIOGRAM REPORT   Patient Name:   [facility] ANN Friis Date of Exam: 06/18/2020 Medical Rec #:  [id number]         Height:       67.0 in Accession #:    [id number]        Weight:       280.0 lb Date of Birth:  08-Mar-1956         BSA:          2.333 m Patient Age:    64 years          BP:           114/70 mmHg Patient Gender: F                 HR:           51 bpm. Exam Location:  Inpatient Procedure: 2D Echo, Intracardiac Opacification Agent, Cardiac Doppler and Color            Doppler Indications:    Dyspnea R06.00  History:        Patient has no prior history of Echocardiogram examinations.                 Risk Factors:Dyslipidemia. COVID 19. Hypothyroid.  Sonographer:    Leta Jungling RDCS Referring Phys: [id number] [facility]  Sonographer Comments: Echo performed with  patient supine and on artificial respirator. IMPRESSIONS  1. Left ventricular ejection fraction, by estimation, is 60 to 65%. The left ventricle has normal function. The left ventricle has no regional wall motion abnormalities. Left ventricular diastolic parameters are indeterminate.  2. Right ventricular systolic function is normal. The right ventricular size is normal. There is normal pulmonary artery systolic pressure. The estimated right ventricular systolic pressure is 30.8 mmHg.  3. The mitral valve is normal in structure. No evidence of mitral valve regurgitation. No evidence of mitral stenosis.  4. The aortic valve is normal in structure. Aortic valve regurgitation is not visualized. No aortic stenosis is present.  5. The inferior vena cava is normal in size with <50%  respiratory variability, suggesting right atrial pressure of 8 mmHg. FINDINGS  Left Ventricle: Left ventricular ejection fraction, by estimation, is 60 to 65%. The left ventricle has normal function. The left ventricle has no regional wall motion abnormalities. The left ventricular internal cavity size was normal in size. There is  no left ventricular hypertrophy. Left ventricular diastolic parameters are indeterminate. Normal left ventricular filling pressure. Right Ventricle: The right ventricular size is normal. No increase in right ventricular wall thickness. Right ventricular systolic function is normal. There is normal pulmonary artery systolic pressure. The tricuspid regurgitant velocity is 2.39 m/s, and  with an assumed right atrial pressure of 8 mmHg, the estimated right ventricular systolic pressure is 30.8 mmHg. Left Atrium: Left atrial size was normal in size. Right Atrium: Right atrial size was normal in size. Pericardium: There is no evidence of pericardial effusion. Mitral Valve: The mitral valve is normal in structure. No evidence of mitral valve regurgitation. No evidence of mitral valve stenosis. Tricuspid Valve: The tricuspid valve is normal in structure. Tricuspid valve regurgitation is trivial. No evidence of tricuspid stenosis. Aortic Valve: The aortic valve is normal in structure. Aortic valve regurgitation is not visualized. No aortic stenosis is present. Pulmonic Valve: The pulmonic valve was normal in structure. Pulmonic valve regurgitation is not visualized. No evidence of pulmonic stenosis. Aorta: The aortic root is normal in size and structure. Venous: The inferior vena cava is normal in size with less than 50% respiratory variability, suggesting right atrial pressure of 8 mmHg. IAS/Shunts: No atrial level shunt detected by color flow Doppler.  LEFT VENTRICLE PLAX 2D LVIDd:         4.80 cm      Diastology LVIDs:         3.40 cm      LV e' medial:    6.32 cm/s LV PW:         0.90 cm       LV E/e' medial:  11.3 LV IVS:        1.10 cm      LV e' lateral:   9.90 cm/s LVOT diam:     2.00 cm      LV E/e' lateral: 7.2 LV SV:         81 LV SV Index:   35 LVOT Area:     3.14 cm  LV Volumes (MOD) LV vol d, MOD A2C: 98.8 ml LV vol d, MOD A4C: 100.0 ml LV vol s, MOD A2C: 10.9 ml LV vol s, MOD A4C: 23.9 ml LV SV MOD A2C:     87.9 ml LV SV MOD A4C:     100.0 ml LV SV MOD BP:      82.8 ml RIGHT VENTRICLE RV S prime:     12.50 cm/s  TAPSE (M-mode): 2.1 cm LEFT ATRIUM             Index       RIGHT ATRIUM           Index LA diam:        3.90 cm 1.67 cm/m  RA Area:     20.20 cm LA Vol (A2C):   36.9 ml 15.82 ml/m RA Volume:   60.20 ml  25.80 ml/m LA Vol (A4C):   37.3 ml 15.99 ml/m LA Biplane Vol: 40.4 ml 17.32 ml/m  AORTIC VALVE LVOT Vmax:   166.00 cm/s LVOT Vmean:  104.000 cm/s LVOT VTI:    0.259 m  AORTA Ao Root diam: 3.00 cm Ao Asc diam:  3.10 cm MITRAL VALVE               TRICUSPID VALVE MV Area (PHT): 3.99 cm    TR Peak grad:   22.8 mmHg MV Decel Time: 190 msec    TR Vmax:        239.00 cm/s MV E velocity: 71.60 cm/s MV A velocity: 45.20 cm/s  SHUNTS MV E/A ratio:  1.58        Systemic VTI:  0.26 m                            Systemic Diam: 2.00 cm Armanda Magic MD Electronically signed by Armanda Magic MD Signature Date/Time: 06/18/2020/3:23:41 PM    Final    VAS [place] LOWER EXTREMITY VENOUS (DVT)  Result Date: 06/19/2020  Lower Venous DVT Study Other Indications: Covid+ hypoxia. Comparison Study: History of venous duplex 06/30/19 (LLE - negative) Performing Technologist: Ernestene Mention  Examination Guidelines: A complete evaluation includes B-mode imaging, spectral Doppler, color Doppler, and power Doppler as needed of all accessible portions of each vessel. Bilateral testing is considered an integral part of a complete examination. Limited examinations for reoccurring indications may be performed as noted. The reflux portion of the exam is performed with the patient in reverse Trendelenburg.   +---------+---------------+---------+-----------+----------+------------------+ RIGHT    CompressibilityPhasicitySpontaneityPropertiesThrombus Aging     +---------+---------------+---------+-----------+----------+------------------+ CFV      Partial        Yes      Yes                  Age Indeterminate  +---------+---------------+---------+-----------+----------+------------------+ SFJ      Partial                                      Age Indeterminate  +---------+---------------+---------+-----------+----------+------------------+ FV Prox  None           No       No                   Acute              +---------+---------------+---------+-----------+----------+------------------+ FV Mid   None           No       No                   Acute              +---------+---------------+---------+-----------+----------+------------------+ FV DistalNone           No       No  Acute              +---------+---------------+---------+-----------+----------+------------------+ PFV                                                   Not visualized     +---------+---------------+---------+-----------+----------+------------------+ POP      None           No       No                                      +---------+---------------+---------+-----------+----------+------------------+ PTV      None           No       No                   Acute - one of                                                           paired             +---------+---------------+---------+-----------+----------+------------------+ PERO                                                  Not visualized     +---------+---------------+---------+-----------+----------+------------------+ Gastroc  None           No       No                   Acute              +---------+---------------+---------+-----------+----------+------------------+   Right Technical Findings:  Not visualized segments include PFV & Peroneal veins.  +---------+---------------+---------+-----------+----------+-------------------+ LEFT     CompressibilityPhasicitySpontaneityPropertiesThrombus Aging      +---------+---------------+---------+-----------+----------+-------------------+ CFV      Full           Yes      Yes                                      +---------+---------------+---------+-----------+----------+-------------------+ SFJ      Full                                                             +---------+---------------+---------+-----------+----------+-------------------+ FV Prox  Full           Yes      Yes                                      +---------+---------------+---------+-----------+----------+-------------------+ FV Mid   Full           Yes  Yes                                      +---------+---------------+---------+-----------+----------+-------------------+ FV DistalFull           Yes      Yes                                      +---------+---------------+---------+-----------+----------+-------------------+ PFV      Full                                                             +---------+---------------+---------+-----------+----------+-------------------+ POP      Full           Yes      Yes                                      +---------+---------------+---------+-----------+----------+-------------------+ PTV      Full                                                             +---------+---------------+---------+-----------+----------+-------------------+ PERO     Full                                         Not well visualized +---------+---------------+---------+-----------+----------+-------------------+    Summary: RIGHT: - Findings consistent with acute deep vein thrombosis involving the right femoral vein, right popliteal vein, right posterior tibial veins, and right gastrocnemius veins. -  Findings consistent with age indeterminate deep vein thrombosis involving the right common femoral vein, and SF junction. - No cystic structure found in the popliteal fossa.  LEFT: - There is no evidence of deep vein thrombosis in the lower extremity.  - No cystic structure found in the popliteal fossa. - VERY difficult exam due to arterial line placement (RLE), patient habitus, and patient thrashing around and kicking  *See table(s) above for measurements and observations. Electronically signed by Waverly Ferrari MD on 06/19/2020 at 2:26:02 PM.    Final    [place] EKG SITE RITE  Result Date: 06/20/2020 If Site Rite image not attached, placement could not be confirmed due to current cardiac rhythm.   Microbiology No results found for this or any previous visit (from the past 240 hour(s)).  Lab Basic Metabolic Panel: Recent Labs  Lab 06/26/20 0429 06/26/20 0721 06/26/20 1130 06/26/20 1537 06/27/20 0550 2020/07/15 0417 2020-07-15 1354  NA 145  --   --   --  144 143 145  K 5.6*   < > 4.6 4.8 5.2* 6.5* 6.7*  CL 100  --   --   --  101 99 103  CO2 36*  --   --   --  36* 35* 35*  GLUCOSE 111*  --   --   --  160* 336* 237*  BUN 115*  --   --   --  113* 139* 153*  CREATININE 1.24*  --   --   --  1.08* 1.61* 1.96*  CALCIUM 8.4*  --   --   --  8.0* 7.9* 7.5*  MG  --   --   --   --  3.1*  --   --   PHOS  --   --   --   --  6.5*  --   --    < > = values in this interval not displayed.   Liver Function Tests: No results for input(s): AST, ALT, ALKPHOS, BILITOT, PROT, ALBUMIN in the last 168 hours. No results for input(s): LIPASE, AMYLASE in the last 168 hours. No results for input(s): AMMONIA in the last 168 hours. CBC: Recent Labs  Lab 06/26/20 0429 06/27/20 0550 07/01/2020 0417  WBC 21.3* 21.1* 41.2*  HGB 11.3* 10.7* 8.7*  HCT 35.8* 36.4 30.0*  MCV 95.2 99.2 103.8*  PLT 208 187 181   Cardiac Enzymes: No results for input(s): CKTOTAL, CKMB, CKMBINDEX, TROPONINI in the last 168  hours. Sepsis Labs: Recent Labs  Lab 06/26/20 0429 06/27/20 0550 06/10/2020 0417  WBC 21.3* 21.1* 41.2*    Procedures/Operations     Krystal Delduca 07/02/2020, 11:43 AM

## 2020-07-10 DEATH — deceased

## 2020-07-17 NOTE — Telephone Encounter (Signed)
Nothing noted in message. Will close encounter.  

## 2022-10-29 IMAGING — DX DG CHEST 1V PORT
1 series · 1 of 1 positions shown · non-contrast
Comparison: 06/20/2020, 06/19/2020

CLINICAL DATA: ARDS

EXAM:
PORTABLE CHEST 1 VIEW

[chest ap]
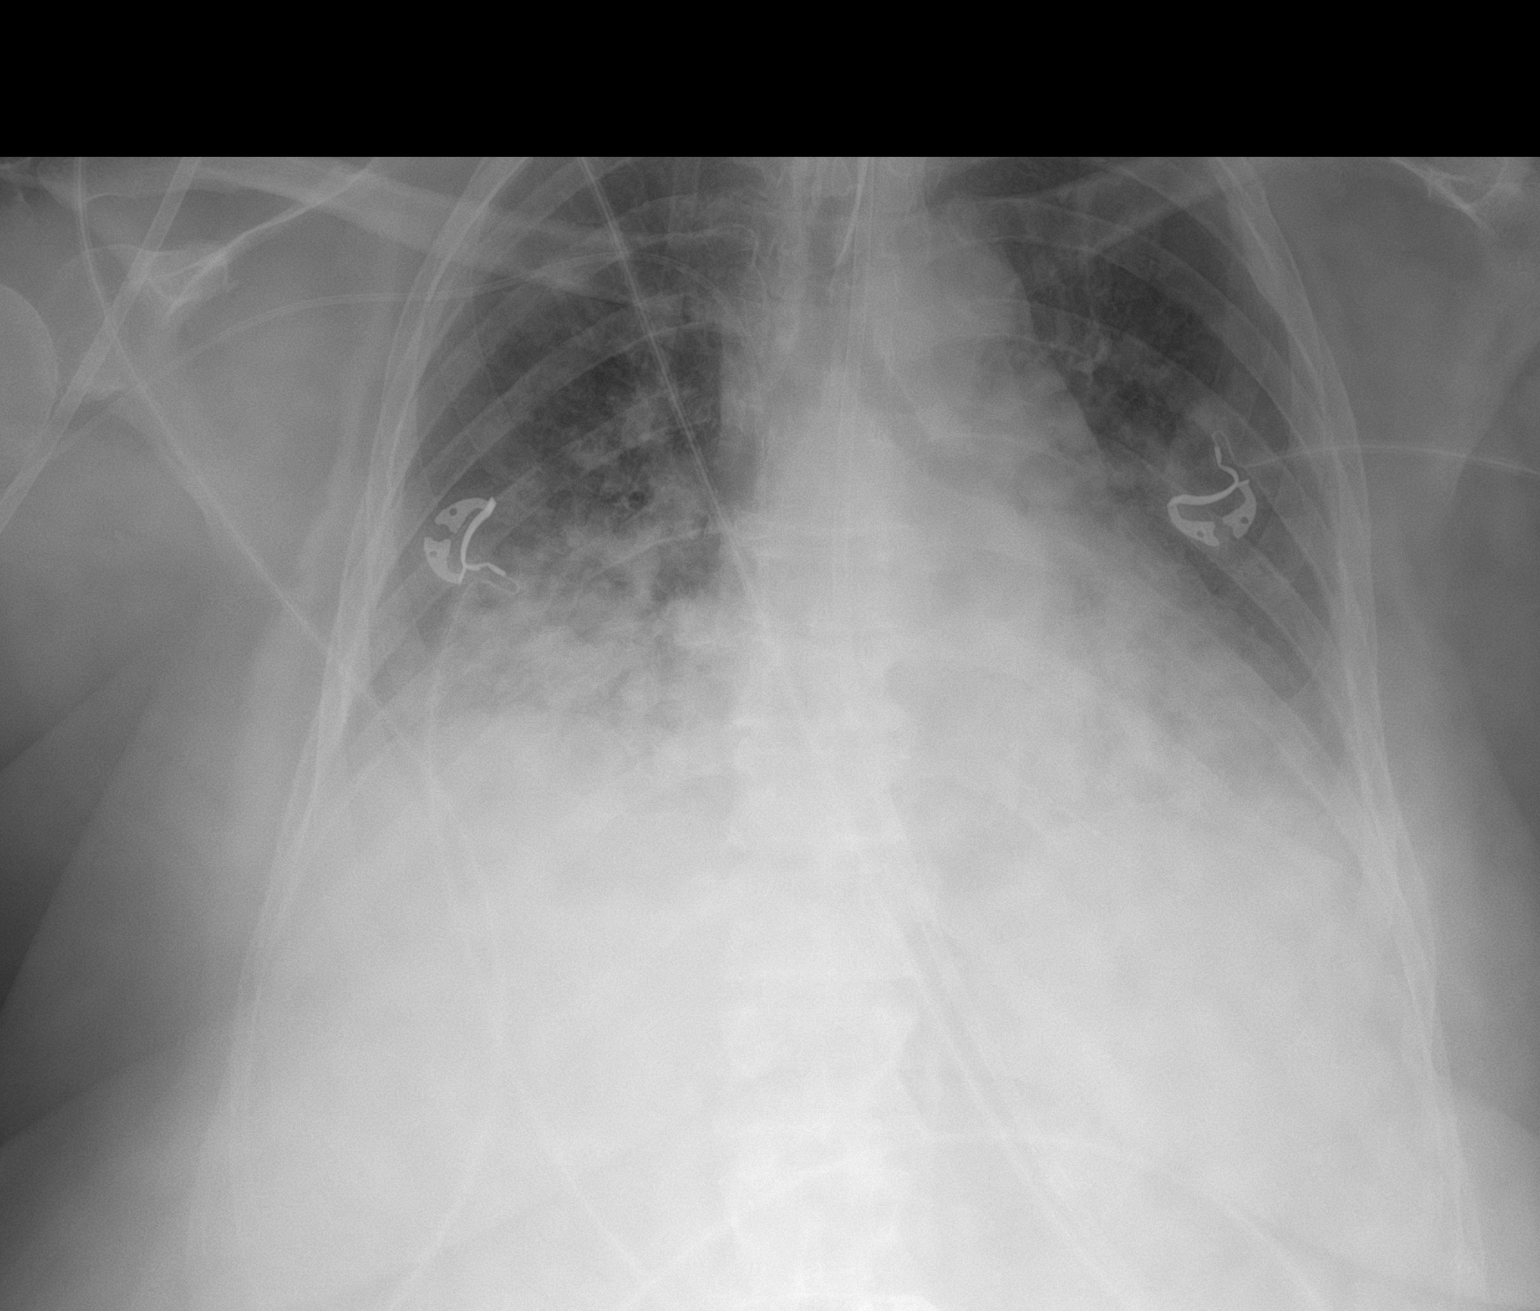

[1 of 1 positions shown; findings below may reference images not displayed]

FINDINGS: *Endotracheal tube tip terminates in the mid to lower trachea,
cm from the carina.
*Transesophageal feeding tube tip terminates below the margins of
imaging.
*Right upper extremity PICC tip terminates near the superior
cavoatrial junction.
*Telemetry leads and external support devices overlie the chest.

Redemonstration of the heterogeneous basilar predominant opacities
in both lungs. No pneumothorax. No pneumothorax or effusion.
Prominent cardiac silhouette is similar to prior with a calcified
aorta. No acute osseous or soft tissue abnormality.
IMPRESSION: 1. Slightly low positioning of the endotracheal tube. Could consider
retraction 2 cm to the mid trachea.
2. Otherwise satisfactory positioning of lines and tubes.
3. Stable lower lung predominant opacities.

## 2022-10-31 IMAGING — DX DG CHEST 1V PORT
1 series · 1 of 1 positions shown · non-contrast
Comparison: Chest radiograph from one day prior.

CLINICAL DATA: Intubated

EXAM:
PORTABLE CHEST 1 VIEW

[chest ap]
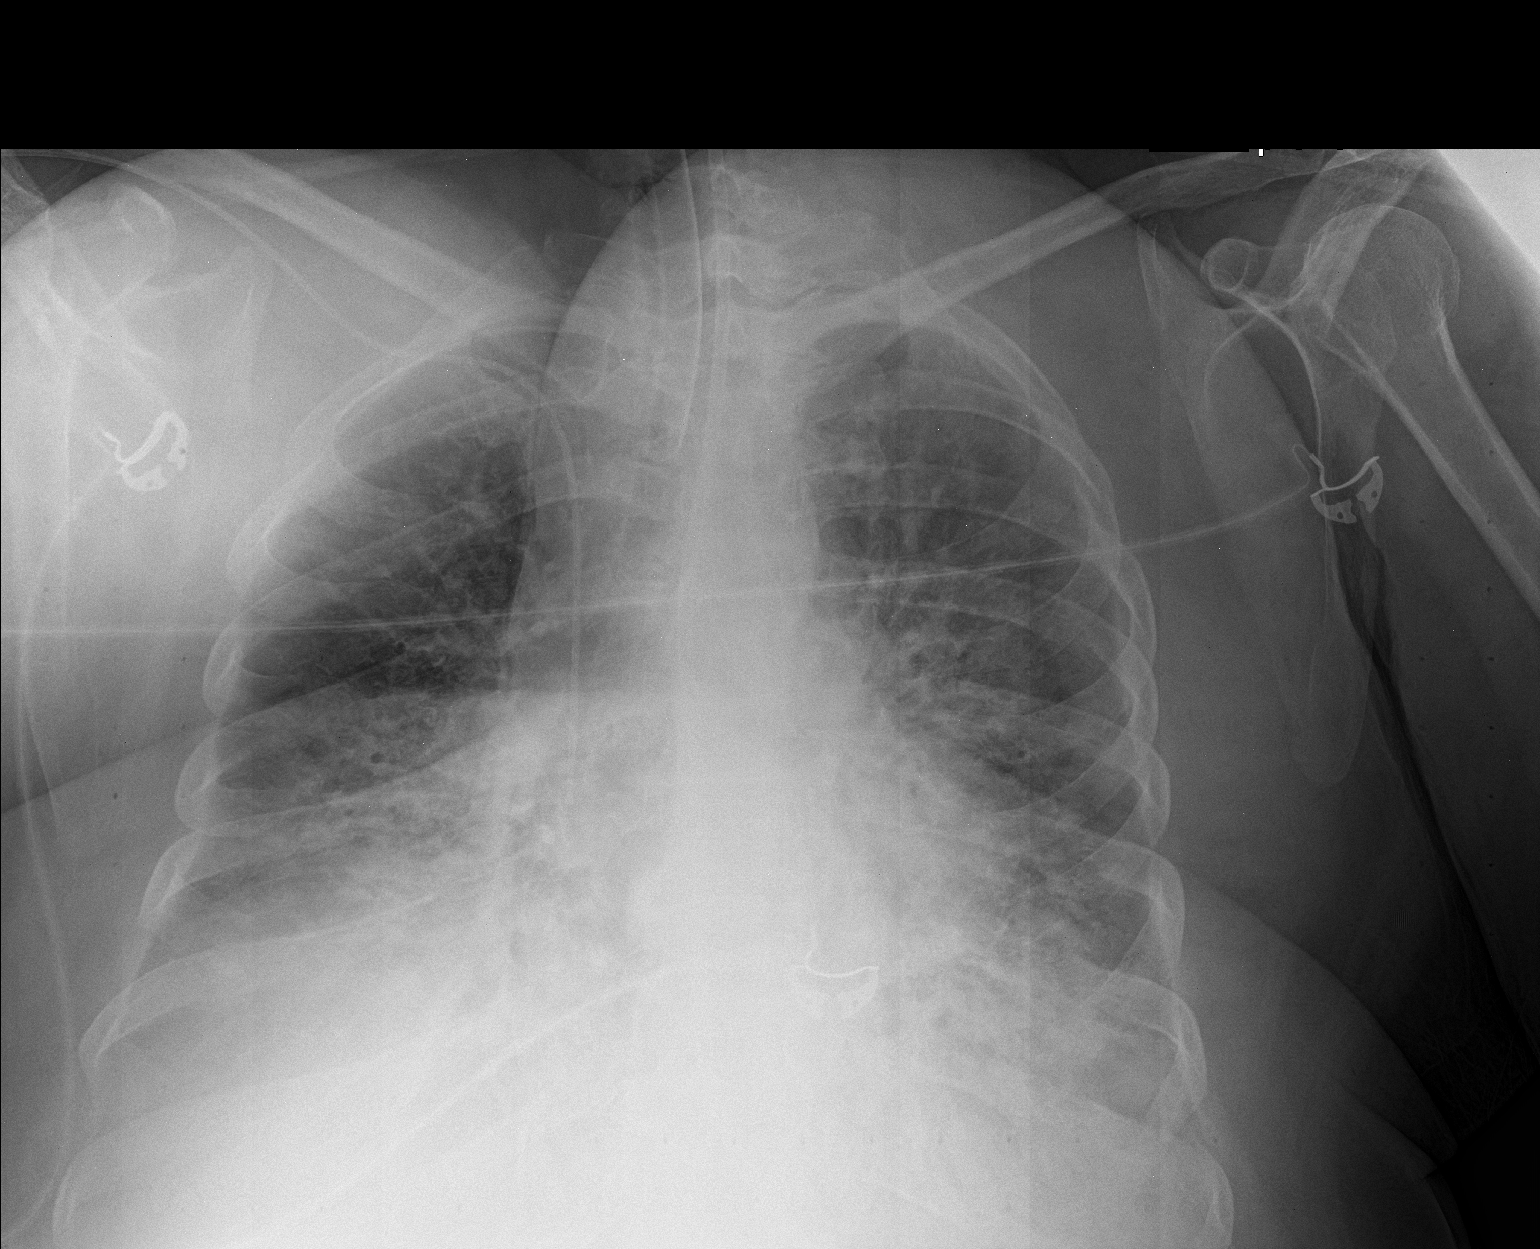

[1 of 1 positions shown; findings below may reference images not displayed]

FINDINGS: Endotracheal tube tip is 4.4 cm above the carina. Enteric tube
enters stomach with the tip not seen on this image. Right PICC
terminates over the cavoatrial junction. Stable cardiomediastinal
silhouette with top-normal heart size. No pneumothorax. No pleural
effusion. Severe patchy opacities throughout both lungs, most
prominent in the lower lungs, similar.
IMPRESSION: 1. Well-positioned support structures.
2. Stable severe patchy opacities throughout both lungs, most
prominent in the lower lungs, compatible with multifocal pneumonia.
